# Patient Record
Sex: Male | Born: 1935 | Race: White | Hispanic: No | Marital: Married | Smoking: Former smoker
Health system: Southern US, Community
[De-identification: ages and names within clinical notes are randomized; demographics above are authoritative.]

## PROBLEM LIST (undated history)

## (undated) DIAGNOSIS — I2510 Atherosclerotic heart disease of native coronary artery without angina pectoris: Secondary | ICD-10-CM

## (undated) DIAGNOSIS — F0390 Unspecified dementia without behavioral disturbance: Secondary | ICD-10-CM

## (undated) DIAGNOSIS — I10 Essential (primary) hypertension: Secondary | ICD-10-CM

## (undated) DIAGNOSIS — I5022 Chronic systolic (congestive) heart failure: Secondary | ICD-10-CM

## (undated) DIAGNOSIS — G4730 Sleep apnea, unspecified: Secondary | ICD-10-CM

## (undated) DIAGNOSIS — K219 Gastro-esophageal reflux disease without esophagitis: Secondary | ICD-10-CM

## (undated) DIAGNOSIS — Z95810 Presence of automatic (implantable) cardiac defibrillator: Secondary | ICD-10-CM

## (undated) DIAGNOSIS — E785 Hyperlipidemia, unspecified: Secondary | ICD-10-CM

## (undated) DIAGNOSIS — I255 Ischemic cardiomyopathy: Secondary | ICD-10-CM

## (undated) DIAGNOSIS — E119 Type 2 diabetes mellitus without complications: Secondary | ICD-10-CM

## (undated) DIAGNOSIS — I340 Nonrheumatic mitral (valve) insufficiency: Secondary | ICD-10-CM

## (undated) DIAGNOSIS — I4891 Unspecified atrial fibrillation: Secondary | ICD-10-CM

## (undated) DIAGNOSIS — D649 Anemia, unspecified: Secondary | ICD-10-CM

## (undated) DIAGNOSIS — J449 Chronic obstructive pulmonary disease, unspecified: Secondary | ICD-10-CM

## (undated) HISTORY — PX: CYSTOSCOPY: SUR368

## (undated) HISTORY — DX: Unspecified atrial fibrillation: I48.91

## (undated) HISTORY — DX: Anemia, unspecified: D64.9

## (undated) HISTORY — DX: Presence of automatic (implantable) cardiac defibrillator: Z95.810

## (undated) HISTORY — DX: Ischemic cardiomyopathy: I25.5

## (undated) HISTORY — DX: Hyperlipidemia, unspecified: E78.5

## (undated) HISTORY — DX: Essential (primary) hypertension: I10

## (undated) HISTORY — DX: Gastro-esophageal reflux disease without esophagitis: K21.9

## (undated) HISTORY — PX: CHOLECYSTECTOMY: SHX55

## (undated) HISTORY — DX: Type 2 diabetes mellitus without complications: E11.9

## (undated) HISTORY — DX: Chronic systolic (congestive) heart failure: I50.22

## (undated) HISTORY — DX: Unspecified dementia, unspecified severity, without behavioral disturbance, psychotic disturbance, mood disturbance, and anxiety: F03.90

## (undated) HISTORY — DX: Nonrheumatic mitral (valve) insufficiency: I34.0

## (undated) HISTORY — DX: Chronic obstructive pulmonary disease, unspecified: J44.9

## (undated) HISTORY — DX: Sleep apnea, unspecified: G47.30

## (undated) HISTORY — PX: INSERT / REPLACE / REMOVE PACEMAKER: SUR710

## (undated) HISTORY — DX: Atherosclerotic heart disease of native coronary artery without angina pectoris: I25.10

---

## 1995-01-19 HISTORY — PX: CORONARY ANGIOPLASTY WITH STENT PLACEMENT: SHX49

## 1997-08-15 ENCOUNTER — Encounter: Admission: RE | Admit: 1997-08-15 | Discharge: 1997-11-13 | Payer: Self-pay | Admitting: Unknown Physician Specialty

## 1997-11-15 ENCOUNTER — Inpatient Hospital Stay (HOSPITAL_COMMUNITY): Admission: AD | Admit: 1997-11-15 | Discharge: 1997-11-19 | Payer: Self-pay | Admitting: Cardiology

## 2001-03-08 ENCOUNTER — Ambulatory Visit (HOSPITAL_COMMUNITY): Admission: RE | Admit: 2001-03-08 | Discharge: 2001-03-08 | Payer: Self-pay | Admitting: Cardiology

## 2001-03-10 ENCOUNTER — Encounter: Payer: Self-pay | Admitting: Neurosurgery

## 2001-03-14 ENCOUNTER — Ambulatory Visit (HOSPITAL_COMMUNITY): Admission: RE | Admit: 2001-03-14 | Discharge: 2001-03-14 | Payer: Self-pay | Admitting: Neurosurgery

## 2001-03-22 ENCOUNTER — Encounter: Payer: Self-pay | Admitting: Neurosurgery

## 2001-03-22 ENCOUNTER — Encounter: Admission: RE | Admit: 2001-03-22 | Discharge: 2001-03-22 | Payer: Self-pay | Admitting: Neurosurgery

## 2001-04-05 ENCOUNTER — Encounter: Payer: Self-pay | Admitting: Neurosurgery

## 2001-04-05 ENCOUNTER — Encounter: Admission: RE | Admit: 2001-04-05 | Discharge: 2001-04-05 | Payer: Self-pay | Admitting: Neurosurgery

## 2001-08-09 ENCOUNTER — Ambulatory Visit (HOSPITAL_COMMUNITY): Admission: RE | Admit: 2001-08-09 | Discharge: 2001-08-09 | Payer: Self-pay | Admitting: Cardiology

## 2002-02-15 ENCOUNTER — Inpatient Hospital Stay (HOSPITAL_COMMUNITY): Admission: EM | Admit: 2002-02-15 | Discharge: 2002-02-17 | Payer: Self-pay | Admitting: Cardiology

## 2002-02-26 ENCOUNTER — Encounter: Payer: Self-pay | Admitting: Emergency Medicine

## 2002-02-26 ENCOUNTER — Emergency Department (HOSPITAL_COMMUNITY): Admission: EM | Admit: 2002-02-26 | Discharge: 2002-02-26 | Payer: Self-pay | Admitting: Emergency Medicine

## 2002-03-23 ENCOUNTER — Ambulatory Visit (HOSPITAL_COMMUNITY): Admission: RE | Admit: 2002-03-23 | Discharge: 2002-03-23 | Payer: Self-pay | Admitting: Internal Medicine

## 2002-05-08 ENCOUNTER — Ambulatory Visit (HOSPITAL_COMMUNITY): Admission: RE | Admit: 2002-05-08 | Discharge: 2002-05-08 | Payer: Self-pay | Admitting: Unknown Physician Specialty

## 2003-11-01 ENCOUNTER — Inpatient Hospital Stay (HOSPITAL_COMMUNITY): Admission: AD | Admit: 2003-11-01 | Discharge: 2003-11-06 | Payer: Self-pay | Admitting: Cardiology

## 2003-11-29 ENCOUNTER — Ambulatory Visit: Payer: Self-pay | Admitting: Cardiology

## 2003-12-19 ENCOUNTER — Ambulatory Visit: Payer: Self-pay | Admitting: Cardiology

## 2003-12-31 ENCOUNTER — Ambulatory Visit: Payer: Self-pay | Admitting: Cardiology

## 2004-01-15 ENCOUNTER — Ambulatory Visit: Payer: Self-pay | Admitting: Cardiology

## 2004-01-19 HISTORY — PX: CARDIAC CATHETERIZATION: SHX172

## 2004-02-14 ENCOUNTER — Ambulatory Visit: Payer: Self-pay | Admitting: Family Medicine

## 2004-02-17 ENCOUNTER — Ambulatory Visit: Payer: Self-pay | Admitting: Cardiology

## 2004-03-16 ENCOUNTER — Ambulatory Visit: Payer: Self-pay | Admitting: Family Medicine

## 2004-03-19 ENCOUNTER — Ambulatory Visit: Payer: Self-pay | Admitting: Cardiology

## 2004-04-21 ENCOUNTER — Ambulatory Visit: Payer: Self-pay | Admitting: Internal Medicine

## 2004-04-23 ENCOUNTER — Ambulatory Visit: Payer: Self-pay | Admitting: Cardiology

## 2004-04-30 ENCOUNTER — Ambulatory Visit: Payer: Self-pay | Admitting: Cardiology

## 2004-05-15 ENCOUNTER — Ambulatory Visit: Payer: Self-pay | Admitting: Cardiology

## 2004-05-20 ENCOUNTER — Ambulatory Visit: Payer: Self-pay | Admitting: Cardiology

## 2004-05-21 ENCOUNTER — Ambulatory Visit: Payer: Self-pay | Admitting: Cardiology

## 2004-06-04 ENCOUNTER — Ambulatory Visit: Payer: Self-pay | Admitting: Cardiology

## 2004-06-12 ENCOUNTER — Ambulatory Visit: Payer: Self-pay | Admitting: Cardiology

## 2004-06-18 ENCOUNTER — Ambulatory Visit: Payer: Self-pay | Admitting: Family Medicine

## 2004-07-08 ENCOUNTER — Ambulatory Visit: Payer: Self-pay | Admitting: Family Medicine

## 2004-07-14 ENCOUNTER — Ambulatory Visit: Payer: Self-pay | Admitting: Cardiology

## 2004-07-17 ENCOUNTER — Ambulatory Visit: Payer: Self-pay | Admitting: Internal Medicine

## 2004-07-31 ENCOUNTER — Ambulatory Visit: Payer: Self-pay | Admitting: *Deleted

## 2004-08-05 ENCOUNTER — Inpatient Hospital Stay (HOSPITAL_BASED_OUTPATIENT_CLINIC_OR_DEPARTMENT_OTHER): Admission: RE | Admit: 2004-08-05 | Discharge: 2004-08-05 | Payer: Self-pay | Admitting: Internal Medicine

## 2004-08-05 ENCOUNTER — Ambulatory Visit: Payer: Self-pay | Admitting: Cardiovascular Disease

## 2004-08-05 ENCOUNTER — Ambulatory Visit: Payer: Self-pay | Admitting: Internal Medicine

## 2004-08-10 ENCOUNTER — Ambulatory Visit: Payer: Self-pay | Admitting: Cardiology

## 2004-08-17 ENCOUNTER — Ambulatory Visit: Payer: Self-pay | Admitting: Cardiology

## 2004-08-19 ENCOUNTER — Ambulatory Visit: Payer: Self-pay | Admitting: Cardiology

## 2004-08-26 ENCOUNTER — Ambulatory Visit: Payer: Self-pay | Admitting: Family Medicine

## 2004-08-28 ENCOUNTER — Ambulatory Visit: Payer: Self-pay | Admitting: Cardiology

## 2004-08-31 ENCOUNTER — Ambulatory Visit: Payer: Self-pay | Admitting: Cardiology

## 2004-09-04 ENCOUNTER — Inpatient Hospital Stay (HOSPITAL_COMMUNITY): Admission: RE | Admit: 2004-09-04 | Discharge: 2004-09-05 | Payer: Self-pay | Admitting: Internal Medicine

## 2004-09-07 ENCOUNTER — Ambulatory Visit: Payer: Self-pay | Admitting: Internal Medicine

## 2004-09-23 ENCOUNTER — Ambulatory Visit: Payer: Self-pay

## 2004-09-25 ENCOUNTER — Ambulatory Visit: Payer: Self-pay | Admitting: Cardiology

## 2004-10-02 ENCOUNTER — Ambulatory Visit: Payer: Self-pay | Admitting: Cardiology

## 2004-10-23 ENCOUNTER — Ambulatory Visit: Payer: Self-pay | Admitting: Cardiology

## 2004-11-06 ENCOUNTER — Ambulatory Visit: Payer: Self-pay | Admitting: Family Medicine

## 2004-11-24 ENCOUNTER — Ambulatory Visit: Payer: Self-pay | Admitting: Internal Medicine

## 2004-12-01 ENCOUNTER — Ambulatory Visit: Payer: Self-pay | Admitting: Family Medicine

## 2004-12-22 ENCOUNTER — Ambulatory Visit: Payer: Self-pay | Admitting: Cardiology

## 2004-12-29 ENCOUNTER — Ambulatory Visit: Payer: Self-pay | Admitting: Internal Medicine

## 2004-12-30 ENCOUNTER — Ambulatory Visit: Payer: Self-pay | Admitting: Cardiology

## 2005-01-19 ENCOUNTER — Ambulatory Visit: Payer: Self-pay | Admitting: Cardiology

## 2005-01-21 ENCOUNTER — Ambulatory Visit: Payer: Self-pay | Admitting: Cardiology

## 2005-02-01 ENCOUNTER — Ambulatory Visit: Payer: Self-pay | Admitting: Family Medicine

## 2005-02-10 ENCOUNTER — Ambulatory Visit: Payer: Self-pay | Admitting: Cardiology

## 2005-03-10 ENCOUNTER — Ambulatory Visit: Payer: Self-pay | Admitting: Cardiology

## 2005-03-30 ENCOUNTER — Ambulatory Visit: Payer: Self-pay | Admitting: Internal Medicine

## 2005-04-07 ENCOUNTER — Ambulatory Visit: Payer: Self-pay | Admitting: Cardiology

## 2005-04-14 ENCOUNTER — Ambulatory Visit: Payer: Self-pay | Admitting: Cardiology

## 2005-05-03 ENCOUNTER — Ambulatory Visit: Payer: Self-pay | Admitting: Family Medicine

## 2005-05-12 ENCOUNTER — Ambulatory Visit: Payer: Self-pay | Admitting: Cardiology

## 2005-05-17 ENCOUNTER — Ambulatory Visit: Payer: Self-pay | Admitting: Family Medicine

## 2005-05-26 ENCOUNTER — Ambulatory Visit: Payer: Self-pay | Admitting: Cardiology

## 2005-06-07 ENCOUNTER — Ambulatory Visit: Payer: Self-pay | Admitting: Family Medicine

## 2005-06-16 ENCOUNTER — Ambulatory Visit: Payer: Self-pay | Admitting: Cardiology

## 2005-06-24 ENCOUNTER — Ambulatory Visit: Payer: Self-pay | Admitting: Internal Medicine

## 2005-07-14 ENCOUNTER — Ambulatory Visit: Payer: Self-pay | Admitting: Cardiology

## 2005-07-26 ENCOUNTER — Ambulatory Visit: Payer: Self-pay | Admitting: Family Medicine

## 2005-07-28 ENCOUNTER — Ambulatory Visit: Payer: Self-pay | Admitting: Cardiology

## 2005-09-01 ENCOUNTER — Ambulatory Visit: Payer: Self-pay | Admitting: Cardiology

## 2005-09-13 ENCOUNTER — Ambulatory Visit: Payer: Self-pay | Admitting: Family Medicine

## 2005-09-15 ENCOUNTER — Ambulatory Visit: Payer: Self-pay | Admitting: Cardiology

## 2005-09-17 ENCOUNTER — Ambulatory Visit: Payer: Self-pay | Admitting: Cardiology

## 2005-10-12 ENCOUNTER — Ambulatory Visit: Payer: Self-pay | Admitting: Cardiology

## 2005-10-13 ENCOUNTER — Ambulatory Visit: Payer: Self-pay | Admitting: Cardiology

## 2005-10-14 ENCOUNTER — Ambulatory Visit: Payer: Self-pay | Admitting: Internal Medicine

## 2005-10-29 ENCOUNTER — Ambulatory Visit: Payer: Self-pay | Admitting: Cardiology

## 2005-11-02 ENCOUNTER — Ambulatory Visit: Payer: Self-pay | Admitting: Cardiology

## 2005-11-05 ENCOUNTER — Ambulatory Visit: Payer: Self-pay | Admitting: Cardiology

## 2005-11-09 ENCOUNTER — Ambulatory Visit: Payer: Self-pay | Admitting: Cardiology

## 2005-11-16 ENCOUNTER — Ambulatory Visit: Payer: Self-pay | Admitting: Cardiology

## 2005-11-23 ENCOUNTER — Ambulatory Visit: Payer: Self-pay | Admitting: Cardiology

## 2005-11-30 ENCOUNTER — Ambulatory Visit: Payer: Self-pay | Admitting: Cardiology

## 2005-12-03 ENCOUNTER — Ambulatory Visit: Payer: Self-pay

## 2005-12-10 ENCOUNTER — Ambulatory Visit: Payer: Self-pay | Admitting: Cardiology

## 2005-12-21 ENCOUNTER — Ambulatory Visit: Payer: Self-pay | Admitting: Family Medicine

## 2005-12-24 ENCOUNTER — Ambulatory Visit: Payer: Self-pay | Admitting: Cardiology

## 2005-12-28 ENCOUNTER — Ambulatory Visit: Payer: Self-pay | Admitting: Internal Medicine

## 2006-01-14 ENCOUNTER — Ambulatory Visit: Payer: Self-pay | Admitting: Cardiology

## 2006-01-28 ENCOUNTER — Ambulatory Visit: Payer: Self-pay | Admitting: Cardiology

## 2006-02-11 ENCOUNTER — Ambulatory Visit: Payer: Self-pay | Admitting: Cardiology

## 2006-03-04 ENCOUNTER — Ambulatory Visit: Payer: Self-pay | Admitting: Cardiology

## 2006-03-14 ENCOUNTER — Ambulatory Visit: Payer: Self-pay | Admitting: Cardiology

## 2006-03-29 ENCOUNTER — Ambulatory Visit: Payer: Self-pay | Admitting: Internal Medicine

## 2006-04-04 ENCOUNTER — Ambulatory Visit: Payer: Self-pay | Admitting: Cardiology

## 2006-04-18 ENCOUNTER — Ambulatory Visit: Payer: Self-pay | Admitting: Cardiology

## 2006-05-02 ENCOUNTER — Ambulatory Visit: Payer: Self-pay | Admitting: Cardiology

## 2006-05-09 ENCOUNTER — Ambulatory Visit: Payer: Self-pay | Admitting: Family Medicine

## 2006-06-03 ENCOUNTER — Ambulatory Visit: Payer: Self-pay | Admitting: Cardiology

## 2006-06-21 ENCOUNTER — Ambulatory Visit: Payer: Self-pay | Admitting: Internal Medicine

## 2006-06-30 ENCOUNTER — Ambulatory Visit: Payer: Self-pay | Admitting: Physician Assistant

## 2006-07-04 ENCOUNTER — Ambulatory Visit: Payer: Self-pay | Admitting: Family Medicine

## 2006-07-08 ENCOUNTER — Ambulatory Visit: Payer: Self-pay | Admitting: Cardiology

## 2006-07-26 ENCOUNTER — Ambulatory Visit: Payer: Self-pay | Admitting: Cardiology

## 2006-08-09 ENCOUNTER — Ambulatory Visit: Payer: Self-pay | Admitting: Cardiology

## 2006-08-17 ENCOUNTER — Ambulatory Visit: Payer: Self-pay | Admitting: Cardiology

## 2006-09-05 ENCOUNTER — Ambulatory Visit: Payer: Self-pay | Admitting: Cardiology

## 2006-09-20 ENCOUNTER — Ambulatory Visit: Payer: Self-pay | Admitting: Internal Medicine

## 2006-09-20 ENCOUNTER — Ambulatory Visit: Payer: Self-pay | Admitting: Cardiology

## 2006-10-11 ENCOUNTER — Ambulatory Visit: Payer: Self-pay | Admitting: Cardiology

## 2006-10-27 ENCOUNTER — Ambulatory Visit: Payer: Self-pay | Admitting: Internal Medicine

## 2006-10-31 ENCOUNTER — Ambulatory Visit: Payer: Self-pay | Admitting: Cardiology

## 2006-11-14 ENCOUNTER — Ambulatory Visit: Payer: Self-pay | Admitting: Cardiology

## 2006-12-12 ENCOUNTER — Ambulatory Visit: Payer: Self-pay | Admitting: Cardiology

## 2006-12-20 ENCOUNTER — Ambulatory Visit: Payer: Self-pay | Admitting: Internal Medicine

## 2006-12-23 ENCOUNTER — Ambulatory Visit: Payer: Self-pay | Admitting: Cardiology

## 2007-01-20 ENCOUNTER — Ambulatory Visit: Payer: Self-pay | Admitting: Cardiology

## 2007-01-27 ENCOUNTER — Ambulatory Visit: Payer: Self-pay | Admitting: Cardiology

## 2007-02-10 ENCOUNTER — Ambulatory Visit: Payer: Self-pay | Admitting: Cardiology

## 2007-03-03 ENCOUNTER — Ambulatory Visit: Payer: Self-pay | Admitting: Cardiology

## 2007-03-21 ENCOUNTER — Ambulatory Visit: Payer: Self-pay | Admitting: Internal Medicine

## 2007-03-24 ENCOUNTER — Ambulatory Visit: Payer: Self-pay | Admitting: Cardiology

## 2007-04-21 ENCOUNTER — Ambulatory Visit: Payer: Self-pay | Admitting: Cardiology

## 2007-05-23 ENCOUNTER — Ambulatory Visit: Payer: Self-pay | Admitting: Cardiology

## 2007-06-15 ENCOUNTER — Ambulatory Visit: Payer: Self-pay | Admitting: Cardiology

## 2007-07-03 ENCOUNTER — Ambulatory Visit: Payer: Self-pay | Admitting: Cardiology

## 2007-07-03 ENCOUNTER — Encounter: Payer: Self-pay | Admitting: Physician Assistant

## 2007-07-04 ENCOUNTER — Encounter: Payer: Self-pay | Admitting: Cardiology

## 2007-07-06 ENCOUNTER — Ambulatory Visit: Payer: Self-pay | Admitting: Internal Medicine

## 2007-07-12 ENCOUNTER — Ambulatory Visit: Payer: Self-pay | Admitting: Cardiology

## 2007-07-17 ENCOUNTER — Encounter: Payer: Self-pay | Admitting: Physician Assistant

## 2007-07-17 ENCOUNTER — Ambulatory Visit: Payer: Self-pay | Admitting: Cardiology

## 2007-08-10 ENCOUNTER — Ambulatory Visit: Payer: Self-pay | Admitting: Cardiology

## 2007-09-11 ENCOUNTER — Ambulatory Visit: Payer: Self-pay | Admitting: Cardiology

## 2007-10-03 ENCOUNTER — Ambulatory Visit: Payer: Self-pay | Admitting: Internal Medicine

## 2007-10-17 ENCOUNTER — Ambulatory Visit: Payer: Self-pay | Admitting: Cardiology

## 2007-10-31 ENCOUNTER — Ambulatory Visit: Payer: Self-pay | Admitting: Cardiology

## 2007-11-21 ENCOUNTER — Ambulatory Visit: Payer: Self-pay | Admitting: Cardiology

## 2007-12-12 ENCOUNTER — Ambulatory Visit: Payer: Self-pay | Admitting: Cardiology

## 2007-12-26 ENCOUNTER — Ambulatory Visit: Payer: Self-pay | Admitting: Cardiology

## 2008-01-03 ENCOUNTER — Ambulatory Visit: Payer: Self-pay | Admitting: Internal Medicine

## 2008-01-23 ENCOUNTER — Ambulatory Visit: Payer: Self-pay | Admitting: Cardiology

## 2008-02-09 ENCOUNTER — Ambulatory Visit: Payer: Self-pay | Admitting: Internal Medicine

## 2008-02-12 ENCOUNTER — Encounter: Payer: Self-pay | Admitting: Internal Medicine

## 2008-02-27 ENCOUNTER — Ambulatory Visit: Payer: Self-pay | Admitting: Cardiology

## 2008-03-26 ENCOUNTER — Ambulatory Visit: Payer: Self-pay | Admitting: Cardiology

## 2008-04-15 ENCOUNTER — Ambulatory Visit: Payer: Self-pay | Admitting: Internal Medicine

## 2008-04-17 ENCOUNTER — Ambulatory Visit: Payer: Self-pay | Admitting: Cardiology

## 2008-05-21 ENCOUNTER — Ambulatory Visit: Payer: Self-pay

## 2008-06-04 ENCOUNTER — Ambulatory Visit: Payer: Self-pay | Admitting: Cardiology

## 2008-06-12 DIAGNOSIS — E119 Type 2 diabetes mellitus without complications: Secondary | ICD-10-CM

## 2008-06-12 DIAGNOSIS — I10 Essential (primary) hypertension: Secondary | ICD-10-CM

## 2008-06-12 DIAGNOSIS — I5022 Chronic systolic (congestive) heart failure: Secondary | ICD-10-CM

## 2008-06-12 DIAGNOSIS — J4489 Other specified chronic obstructive pulmonary disease: Secondary | ICD-10-CM | POA: Insufficient documentation

## 2008-06-12 DIAGNOSIS — G4730 Sleep apnea, unspecified: Secondary | ICD-10-CM | POA: Insufficient documentation

## 2008-06-12 DIAGNOSIS — I4891 Unspecified atrial fibrillation: Secondary | ICD-10-CM | POA: Insufficient documentation

## 2008-06-12 DIAGNOSIS — D649 Anemia, unspecified: Secondary | ICD-10-CM | POA: Insufficient documentation

## 2008-06-12 DIAGNOSIS — I2510 Atherosclerotic heart disease of native coronary artery without angina pectoris: Secondary | ICD-10-CM | POA: Insufficient documentation

## 2008-06-12 DIAGNOSIS — K219 Gastro-esophageal reflux disease without esophagitis: Secondary | ICD-10-CM | POA: Insufficient documentation

## 2008-06-12 DIAGNOSIS — N289 Disorder of kidney and ureter, unspecified: Secondary | ICD-10-CM | POA: Insufficient documentation

## 2008-06-12 DIAGNOSIS — I080 Rheumatic disorders of both mitral and aortic valves: Secondary | ICD-10-CM

## 2008-06-12 DIAGNOSIS — E785 Hyperlipidemia, unspecified: Secondary | ICD-10-CM | POA: Insufficient documentation

## 2008-06-12 DIAGNOSIS — Z95810 Presence of automatic (implantable) cardiac defibrillator: Secondary | ICD-10-CM | POA: Insufficient documentation

## 2008-06-12 DIAGNOSIS — I2589 Other forms of chronic ischemic heart disease: Secondary | ICD-10-CM | POA: Insufficient documentation

## 2008-06-12 DIAGNOSIS — J449 Chronic obstructive pulmonary disease, unspecified: Secondary | ICD-10-CM

## 2008-06-18 ENCOUNTER — Ambulatory Visit: Payer: Self-pay | Admitting: Cardiology

## 2008-06-21 ENCOUNTER — Ambulatory Visit: Payer: Self-pay | Admitting: Internal Medicine

## 2008-07-05 ENCOUNTER — Encounter: Payer: Self-pay | Admitting: Internal Medicine

## 2008-07-09 ENCOUNTER — Ambulatory Visit: Payer: Self-pay | Admitting: Cardiology

## 2008-07-09 ENCOUNTER — Telehealth: Payer: Self-pay | Admitting: Cardiology

## 2008-08-06 ENCOUNTER — Ambulatory Visit: Payer: Self-pay

## 2008-09-02 ENCOUNTER — Encounter: Payer: Self-pay | Admitting: *Deleted

## 2008-09-06 ENCOUNTER — Encounter: Payer: Self-pay | Admitting: Cardiology

## 2008-09-06 ENCOUNTER — Ambulatory Visit: Payer: Self-pay | Admitting: Cardiology

## 2008-09-06 LAB — CONVERTED CEMR LAB: Prothrombin Time: 25.5 s

## 2008-09-09 ENCOUNTER — Telehealth: Payer: Self-pay | Admitting: Cardiology

## 2008-09-23 ENCOUNTER — Encounter: Payer: Self-pay | Admitting: Internal Medicine

## 2008-09-25 ENCOUNTER — Ambulatory Visit: Payer: Self-pay | Admitting: Internal Medicine

## 2008-10-04 ENCOUNTER — Ambulatory Visit: Payer: Self-pay | Admitting: Cardiology

## 2008-10-10 ENCOUNTER — Encounter: Payer: Self-pay | Admitting: Internal Medicine

## 2008-11-01 ENCOUNTER — Ambulatory Visit: Payer: Self-pay | Admitting: Cardiology

## 2008-11-01 LAB — CONVERTED CEMR LAB: POC INR: 2

## 2008-11-21 ENCOUNTER — Ambulatory Visit: Payer: Self-pay | Admitting: Cardiology

## 2008-11-25 ENCOUNTER — Encounter: Payer: Self-pay | Admitting: Cardiology

## 2008-11-25 ENCOUNTER — Telehealth (INDEPENDENT_AMBULATORY_CARE_PROVIDER_SITE_OTHER): Payer: Self-pay | Admitting: *Deleted

## 2008-11-29 ENCOUNTER — Ambulatory Visit: Payer: Self-pay | Admitting: Cardiology

## 2008-11-29 LAB — CONVERTED CEMR LAB: POC INR: 1.5

## 2008-12-06 ENCOUNTER — Encounter: Payer: Self-pay | Admitting: Cardiology

## 2008-12-10 ENCOUNTER — Ambulatory Visit: Payer: Self-pay | Admitting: Cardiology

## 2008-12-10 LAB — CONVERTED CEMR LAB: POC INR: 1.7

## 2008-12-24 ENCOUNTER — Encounter: Payer: Self-pay | Admitting: Internal Medicine

## 2008-12-27 ENCOUNTER — Encounter: Payer: Self-pay | Admitting: Cardiology

## 2009-01-02 ENCOUNTER — Encounter: Payer: Self-pay | Admitting: Internal Medicine

## 2009-01-03 ENCOUNTER — Ambulatory Visit: Payer: Self-pay | Admitting: Cardiology

## 2009-01-24 ENCOUNTER — Ambulatory Visit: Payer: Self-pay | Admitting: Cardiology

## 2009-02-21 ENCOUNTER — Ambulatory Visit: Payer: Self-pay | Admitting: Cardiology

## 2009-02-21 LAB — CONVERTED CEMR LAB: POC INR: 3.1

## 2009-02-26 ENCOUNTER — Encounter: Payer: Self-pay | Admitting: Cardiology

## 2009-03-21 ENCOUNTER — Ambulatory Visit: Payer: Self-pay | Admitting: Cardiology

## 2009-03-21 LAB — CONVERTED CEMR LAB: POC INR: 2.9

## 2009-03-25 ENCOUNTER — Encounter: Payer: Self-pay | Admitting: Cardiology

## 2009-03-25 ENCOUNTER — Inpatient Hospital Stay (HOSPITAL_COMMUNITY): Admission: EM | Admit: 2009-03-25 | Discharge: 2009-03-28 | Payer: Self-pay | Admitting: Internal Medicine

## 2009-03-25 ENCOUNTER — Ambulatory Visit: Payer: Self-pay | Admitting: Internal Medicine

## 2009-03-25 ENCOUNTER — Telehealth: Payer: Self-pay | Admitting: Internal Medicine

## 2009-03-28 ENCOUNTER — Encounter: Payer: Self-pay | Admitting: Internal Medicine

## 2009-04-11 ENCOUNTER — Ambulatory Visit: Payer: Self-pay | Admitting: Internal Medicine

## 2009-04-18 ENCOUNTER — Ambulatory Visit: Payer: Self-pay | Admitting: Cardiology

## 2009-04-18 LAB — CONVERTED CEMR LAB: POC INR: 2.7

## 2009-05-05 ENCOUNTER — Ambulatory Visit: Payer: Self-pay | Admitting: Cardiology

## 2009-05-20 ENCOUNTER — Ambulatory Visit: Payer: Self-pay | Admitting: Cardiology

## 2009-05-20 LAB — CONVERTED CEMR LAB: POC INR: 2.3

## 2009-06-17 ENCOUNTER — Ambulatory Visit: Payer: Self-pay | Admitting: Cardiology

## 2009-06-17 LAB — CONVERTED CEMR LAB: POC INR: 2.8

## 2009-06-27 ENCOUNTER — Ambulatory Visit: Payer: Self-pay | Admitting: Internal Medicine

## 2009-07-15 ENCOUNTER — Ambulatory Visit: Payer: Self-pay | Admitting: Cardiology

## 2009-07-15 ENCOUNTER — Telehealth (INDEPENDENT_AMBULATORY_CARE_PROVIDER_SITE_OTHER): Payer: Self-pay | Admitting: *Deleted

## 2009-08-12 ENCOUNTER — Ambulatory Visit: Payer: Self-pay | Admitting: Cardiology

## 2009-08-12 LAB — CONVERTED CEMR LAB: POC INR: 1.7

## 2009-09-03 ENCOUNTER — Encounter: Payer: Self-pay | Admitting: Internal Medicine

## 2009-09-09 ENCOUNTER — Ambulatory Visit: Payer: Self-pay | Admitting: Cardiology

## 2009-09-09 LAB — CONVERTED CEMR LAB: POC INR: 1.7

## 2009-09-25 ENCOUNTER — Telehealth: Payer: Self-pay | Admitting: Internal Medicine

## 2009-09-26 ENCOUNTER — Encounter: Payer: Self-pay | Admitting: Internal Medicine

## 2009-09-30 ENCOUNTER — Ambulatory Visit: Payer: Self-pay | Admitting: Cardiology

## 2009-09-30 LAB — CONVERTED CEMR LAB: POC INR: 3.4

## 2009-10-06 ENCOUNTER — Ambulatory Visit: Payer: Self-pay | Admitting: Internal Medicine

## 2009-10-14 ENCOUNTER — Ambulatory Visit: Payer: Self-pay | Admitting: Cardiology

## 2009-10-28 ENCOUNTER — Ambulatory Visit: Payer: Self-pay | Admitting: Cardiology

## 2009-10-29 ENCOUNTER — Encounter: Payer: Self-pay | Admitting: Internal Medicine

## 2009-11-25 ENCOUNTER — Ambulatory Visit: Payer: Self-pay | Admitting: Cardiology

## 2009-11-25 LAB — CONVERTED CEMR LAB: POC INR: 3

## 2009-12-15 ENCOUNTER — Ambulatory Visit: Payer: Self-pay

## 2009-12-23 ENCOUNTER — Ambulatory Visit: Payer: Self-pay | Admitting: Cardiology

## 2009-12-23 LAB — CONVERTED CEMR LAB: POC INR: 2.8

## 2010-01-07 ENCOUNTER — Ambulatory Visit: Payer: Self-pay | Admitting: Internal Medicine

## 2010-01-20 ENCOUNTER — Ambulatory Visit: Admission: RE | Admit: 2010-01-20 | Discharge: 2010-01-20 | Payer: Self-pay | Source: Home / Self Care

## 2010-02-17 ENCOUNTER — Ambulatory Visit: Admit: 2010-02-17 | Payer: Self-pay

## 2010-02-19 NOTE — Medication Information (Signed)
Summary: ccr-lr  Anticoagulant Therapy  Managed by: Vashti Hey, RN Referring MD: Andee Lineman PCP: Dr. Ardeen Garland Supervising MD: Diona Browner MD, Remi Deter Indication 1: Atrial Fibrillation (ICD-427.31) Lab [facility] [place] Site: [facility] INR POC 2.4  Dietary changes: no    Health status changes: no    Bleeding/hemorrhagic complications: no    Recent/future hospitalizations: no    Any changes in medication regimen? no    Recent/future dental: no  Any missed doses?: no       Is patient compliant with meds? yes       Allergies: 1)  ! Pcn 2)  ! Celebrex 3)  ! * Ambien  Anticoagulation Management History:      The patient is taking warfarin and comes in today for a routine follow up visit.  Positive risk factors for bleeding include an age of 75 years or older and presence of serious comorbidities.  The bleeding index is 'intermediate risk'.  Positive CHADS2 values include History of CHF, History of HTN, Age > 42 years old, and History of Diabetes.  The start date was 10/18/2002.  His last INR was 2.2.  Anticoagulation responsible provider: Diona Browner MD, Remi Deter.  INR POC: 2.4.  Cuvette Lot#: [id number].  Exp: 10/11.    Anticoagulation Management Assessment/Plan:      The patient's current anticoagulation dose is Warfarin sodium 5 mg tabs: Take 1-2 tablet by mouth once a day.  The target INR is 2 - 3.  The next INR is due 02/17/2010.  Anticoagulation instructions were given to patient.  Results were reviewed/authorized by Vashti Hey, RN.  He was notified by Vashti Hey RN.         Prior Anticoagulation Instructions: INR 2.8 Continue coumadin 5mg  once daily except 2.5mg  on M,W,F  Current Anticoagulation Instructions: INR 2.4 Continue coumadin 5mg  once daily except 2.5mg  on M,W,F

## 2010-02-19 NOTE — Medication Information (Signed)
Summary: ccr-lr  Anticoagulant Therapy  Managed by: Vashti Hey, RN Referring MD: Andee Lineman PCP: Dr. Ardeen Garland Supervising MD: Antoine Poche MD, Fayrene Fearing Indication 1: Atrial Fibrillation (ICD-427.31) Lab [facility] [place] Site: [facility] Care Clinic INR POC 2.8  Dietary changes: no    Health status changes: no    Bleeding/hemorrhagic complications: no    Recent/future hospitalizations: no    Any changes in medication regimen? no    Recent/future dental: no  Any missed doses?: no       Is patient compliant with meds? yes       Allergies: 1)  ! Pcn 2)  ! Celebrex 3)  ! Clarene Essex  Anticoagulation Management History:      The patient is taking warfarin and comes in today for a routine follow up visit.  Positive risk factors for bleeding include an age of 2 years or older and presence of serious comorbidities.  The bleeding index is 'intermediate risk'.  Positive CHADS2 values include History of CHF, History of HTN, Age > 47 years old, and History of Diabetes.  The start date was 10/18/2002.  His last INR was 2.2.  Anticoagulation responsible provider: Antoine Poche MD, Fayrene Fearing.  INR POC: 2.8.  Cuvette Lot#: [id number].  Exp: 10/11.    Anticoagulation Management Assessment/Plan:      The patient's current anticoagulation dose is Warfarin sodium 5 mg tabs: Take 1-2 tablet by mouth once a day.  The target INR is 2 - 3.  The next INR is due 07/15/2009.  Anticoagulation instructions were given to patient.  Results were reviewed/authorized by Vashti Hey, RN.  He was notified by Vashti Hey RN.         Prior Anticoagulation Instructions: INR 2.3 Continue coumadin 2.5mg  once daily except 5mg  on S,T,Th  Current Anticoagulation Instructions: INR 2.8 Continue coumadin 2.5mg  once daily except 5mg  on S,T,Th

## 2010-02-19 NOTE — Medication Information (Signed)
Summary: ccr-lr  Anticoagulant Therapy  Managed by: Vashti Hey, RN Referring MD: Andee Lineman PCP: Dr. Ardeen Garland Supervising MD: Diona Browner MD, Remi Deter Indication 1: Atrial Fibrillation (ICD-427.31) Lab Used: [facility] [place] Site: [facility] Clinic INR POC 2.7  Dietary changes: no    Health status changes: no    Bleeding/hemorrhagic complications: no    Recent/future hospitalizations: no    Any changes in medication regimen? no    Recent/future dental: no  Any missed doses?: no       Is patient compliant with meds? yes       Allergies: 1)  ! Pcn 2)  ! Celebrex 3)  ! Clarene Essex  Anticoagulation Management History:      The patient is taking warfarin and comes in today for a routine follow up visit.  Positive risk factors for bleeding include an age of 75 years or older and presence of serious comorbidities.  The bleeding index is 'intermediate risk'.  Positive CHADS2 values include History of CHF, History of HTN, Age > 87 years old, and History of Diabetes.  The start date was 10/18/2002.  His last INR was 2.2.  Anticoagulation responsible provider: Diona Browner MD, Remi Deter.  INR POC: 2.7.  Cuvette Lot#: [id number].  Exp: 10/11.    Anticoagulation Management Assessment/Plan:      The patient's current anticoagulation dose is Warfarin sodium 5 mg tabs: Take 1-2 tablet by mouth once a day.  The target INR is 2 - 3.  The next INR is due 02/21/2009.  Anticoagulation instructions were given to patient.  Results were reviewed/authorized by Vashti Hey, RN.  He was notified by Vashti Hey RN.         Prior Anticoagulation Instructions: INR 2.5 Continue coumadin 5mg  once daily except 2.5mg  on M,W,F  Current Anticoagulation Instructions: INR 2.7 Continue coumadin 5mg  once daily except 2.5mg  on M,W,F

## 2010-02-19 NOTE — Medication Information (Signed)
Summary: ccr-lr  Anticoagulant Therapy  Managed by: Vashti Hey, RN Referring MD: Andee Lineman PCP: Dr. Ardeen Garland Supervising MD: Andee Lineman MD, Michelle Piper Indication 1: Atrial Fibrillation (ICD-427.31) Lab [facility]  Site: [facility] Care Clinic INR POC 3.4  Dietary changes: no    Health status changes: no    Bleeding/hemorrhagic complications: no    Recent/future hospitalizations: no    Any changes in medication regimen? no    Recent/future dental: no  Any missed doses?: no       Is patient compliant with meds? yes       Allergies: 1)  ! Pcn 2)  ! Celebrex 3)  ! Clarene Essex  Anticoagulation Management History:      The patient is taking warfarin and comes in today for a routine follow up visit.  Positive risk factors for bleeding include an age of 75 years or older and presence of serious comorbidities.  The bleeding index is 'intermediate risk'.  Positive CHADS2 values include History of CHF, History of HTN, Age > 87 years old, and History of Diabetes.  The start date was 10/18/2002.  His last INR was 2.2.  Anticoagulation responsible Jahayra Mazo: Andee Lineman MD, Michelle Piper.  INR POC: 3.4.  Cuvette Lot#: [id number].  Exp: 10/11.    Anticoagulation Management Assessment/Plan:      The patient's current anticoagulation dose is Warfarin sodium 5 mg tabs: Take 1-2 tablet by mouth once a day.  The target INR is 2 - 3.  The next INR is due 10/28/2009.  Anticoagulation instructions were given to patient.  Results were reviewed/authorized by Vashti Hey, RN.  He was notified by Vashti Hey RN.         Prior Anticoagulation Instructions: INR 1.7 Increase coumadin to 5mg  once daily except 2.5mg  on Mondays and Fridays  Current Anticoagulation Instructions: INR 3.4 Take coumadin 2.5mg  tonight then decrease dose to 5mg  once daily except 2.5mg  on M,W,F

## 2010-02-19 NOTE — Assessment & Plan Note (Signed)
Summary: NEW DEVICE-SRS   Visit Type:  Pacemaker check Primary Provider:  Dr. Ardeen Garland  CC:  pacemaker check .  History of Present Illness: The patient presents today for routine electrophysiology followup. He reports doing very well since having his fidelis lead revision. The patient denies symptoms of palpitations, chest pain, shortness of breath, orthopnea, PND, lower extremity edema, dizziness, presyncope, syncope, or neurologic sequela. The patient is tolerating medications without difficulties and is otherwise without complaint today.   Preventive Screening-Counseling & Management  Alcohol-Tobacco     Smoking Status: quit     Year Quit: 1996  Current Medications (verified): 1)  Amiodarone Hcl 200 Mg Tabs (Amiodarone Hcl) .... Take 1/2 Tablet (100mg ) Daily 2)  Isosorbide Mononitrate Cr 60 Mg Xr24h-Tab (Isosorbide Mononitrate) .... Take 1 Tablet By Mouth Once A Day 3)  Lasix 80 Mg Tabs (Furosemide) .... One and A Half By Mouth Two Times A Day 4)  Lipitor 10 Mg Tabs (Atorvastatin Calcium) .... Take 1 Tablet By Mouth Once A Day 5)  Toprol Xl 25 Mg Xr24h-Tab (Metoprolol Succinate) .... Take 1 Tablet By Mouth Once A Day 6)  Warfarin Sodium 5 Mg Tabs (Warfarin Sodium) .... Take 1-2 Tablet By Mouth Once A Day 7)  Avapro 150 Mg Tabs (Irbesartan) .... Take 1 Tablet By Mouth Once A Day 8)  Aspirin 81 Mg Tbec (Aspirin) .... Take 1 Tablet By Mouth Once A Day 9)  Digoxin 0.125 Mg Tabs (Digoxin) .... Take 1 Tablet By Mouth Once A Day 10)  Omeprazole 20 Mg Cpdr (Omeprazole) .... Take 1 Tablet By Mouth Once A Day 11)  Cymbalta 60 Mg Cpep (Duloxetine Hcl) .... Take 1 Tablet By Mouth Once A Day 12)  Renal Multivitamin/zinc  Tabs (Multiple Vitamin) .... Take 1 Tablet By Mouth Once A Day 13)  Neurontin 100 Mg Caps (Gabapentin) .... Take 1 Tablet By Mouth Once A Day 14)  Synthroid 100 Mcg Tabs (Levothyroxine Sodium) .... One By Mouth Daily 15)  Tylenol Extra Strength 500 Mg Tabs (Acetaminophen)  .... 2 By Mouth Two Times A Day 16)  Valium 5 Mg Tabs (Diazepam) .... One By Mouth Three Times A Day 17)  Vitamin D 16109 Unit Tabs (Cholecalciferol) .... One By Mouth Weekly 18)  Humulin N Pen 100 Unit/ml Susp (Insulin Isophane Human) .... 55 Units Two Times A Day 19)  Humulin R 100 Unit/ml Soln (Insulin Regular Human) .... 7-10 Units Two Times A Day 20)  Allergy Relief 10 Mg Tabs (Loratadine) .... Take 1 Tablet By Mouth Once A Day  Allergies: 1)  ! Pcn 2)  ! Celebrex 3)  ! Clarene Essex  Comments:  Nurse/Medical Assistant: The patients medication list was updated, however, the patient is on some medications where they do not know the name or dosage at this time. Instructed to contact our office with details. Will update medication list at that time.  Past History:  Past Medical History: Reviewed history from 11/21/2008 and no changes required. SYSTOLIC HEART FAILURE, CHRONIC (ICD-428.22) (EF was 20%, 50% echo 2007) ICD - IN SITU (ICD-V45.02) CARDIOMYOPATHY, ISCHEMIC (ICD-414.8) ATRIAL FIBRILLATION (ICD-427.31) MITRAL REGURGITATION, 0 (MILD) (ICD-396.3) CAD, UNSPECIFIED SITE (ICD-414.00)Coronary artery disease, status post multiple percutaneous coronary nterventions in the past. Last cardiac catheterization in July 2006:  Totally occluded RCA with left-to-right     collaterals, and a 70-75% mid-to-distal obtuse marginal stenosis - medical therapy.) HYPERTENSION, UNSPECIFIED (ICD-401.9) HYPERLIPIDEMIA-MIXED (ICD-272.4) COPD (ICD-496) RENAL DISEASE, CHRONIC (ICD-593.9) GERD (ICD-530.81) DIABETES MELLITUS (ICD-250.00) SLEEP APNEA (ICD-780.57) ANEMIA (  ICD-285.9)  Past Surgical History: Reviewed history from 11/21/2008 and no changes required. ICD - [employer] InSync Sentry [phone] - 09/04/04 Cholecystectomy Cystoscopy for bladder CA years ago  Social History: Reviewed history from 06/12/2008 and no changes required. Retired  Married  Tobacco Use - Former. - 1996 Alcohol Use -  no Regular Exercise - no Drug Use - no  Review of Systems       All systems are reviewed and negative except as listed in the HPI.   Vital Signs:  Patient profile:   77 year old male Height:      72 inches Weight:      231 pounds O2 Sat:      85 % on Room air Pulse rate:   87 / minute BP sitting:   124 / 73  (left arm)  Vitals Entered By: Youlanda Mighty RN (June 27, 2009 3:47 PM)  O2 Flow:  Room air CC: pacemaker check    Physical Exam  General:  elderly, NAD Head:  normocephalic and atraumatic Eyes:  PERRLA/EOM intact; conjunctiva and lids normal. Mouth:  Teeth, gums and palate normal. Oral mucosa normal. Neck:  supple Chest Wall:  ICD pocket is well healed Lungs:  Clear bilaterally to auscultation and percussion. Heart:  RRR Abdomen:  Bowel sounds positive; abdomen soft and non-tender without masses, organomegaly, or hernias noted. No hepatosplenomegaly. Msk:  Back normal, normal gait. Muscle strength and tone normal. Pulses:  pulses normal in all 4 extremities Extremities:  No clubbing or cyanosis. Neurologic:  Alert and oriented x 3.    ICD Specifications Following MD:  Sherryl Manges, MD     ICD Vendor:  [employer]     ICD Model Number:  7299     ICD Serial Number:  [id number] ICD DOI:  09/04/2004     ICD Implanting MD:  Sherryl Manges, MD  Lead 1:    Location: RA     DOI: 09/04/2004     Model #: [id number]     Serial #: [id number]     Status: active Lead 2:    Location: RV     DOI: 09/04/2004     Model #: [id number]     Serial #: [id number]     Status: active Lead 3:    Location: LV     DOI: 09/04/2004     Model #: [id number]     Serial #: [id number]     Status: active  Indications::  CHF, NICM   ICD Follow Up Remote Check?  No Battery Voltage:  3.21 V     Charge Time:  8.5 seconds     Underlying rhythm:  SR ICD Dependent:  No       ICD Device Measurements Atrium:  Amplitude: 2.4 mV, Impedance: 494 ohms, Threshold: 0.75 V at 0.4 msec Right Ventricle:  Amplitude: 20 mV,  Impedance: 627 ohms, Threshold: 1.25 V at 0.6 msec Left Ventricle:  Impedance: 494 ohms, Threshold: 1.0 V at 0.4 msec Shock Impedance: 54/71 ohms   Episodes MS Episodes:  0     Percent Mode Switch:  0     Coumadin:  Yes Shock:  0     ATP:  0     Nonsustained:  0     Atrial Pacing:  0.2%     Ventricular Pacing:  99.9%  Brady Parameters Mode DDD     Lower Rate Limit:  50     Upper Rate Limit 130 PAV 190     Sensed AV  Delay:  160  Tachy Zones VF:  200     VT:  FVT VIA VF 250     VT1:  176     Tech Comments:  Atrial sensitivity reprogrammed 0.82msec for FFRW.  RV output reprogrammed 2.5@ 0.6.  Optivol and thoracic impedance abnormal 5/12-6/3.  Carelink transmissions every 3 months.  ROV 1 year with Dr. Johney Frame in [place]. Altha Harm, LPN  June 27, 2009 4:18 PM  MD Comments:  agree  Impression & Recommendations:  Problem # 1:  ICD - IN SITU (ICD-V45.02) normal ICD function changes as above  Problem # 2:  SYSTOLIC HEART FAILURE, CHRONIC (ICD-428.22) stable continue current medicine  Problem # 3:  ATRIAL FIBRILLATION (ICD-427.31) stable continue coumadin and amiodarone  Problem # 4:  HYPERTENSION, UNSPECIFIED (ICD-401.9) stable  Patient Instructions: 1)  carelink checks every 3 months 2)  return in 12 months

## 2010-02-19 NOTE — Medication Information (Signed)
Summary: ccr-lr  Anticoagulant Therapy  Managed by: Vashti Hey, RN Referring MD: Andee Lineman PCP: Dr. Ardeen Garland Supervising MD: Andee Lineman MD, Michelle Piper Indication 1: Atrial Fibrillation (ICD-427.31) Lab Used: [facility] [place] Site: [facility] INR POC 1.7  Dietary changes: no    Health status changes: no    Bleeding/hemorrhagic complications: no    Recent/future hospitalizations: no    Any changes in medication regimen? no    Recent/future dental: no  Any missed doses?: no       Is patient compliant with meds? yes       Allergies: 1)  ! Pcn 2)  ! Celebrex 3)  ! Clarene Essex  Anticoagulation Management History:      The patient is taking warfarin and comes in today for a routine follow up visit.  Positive risk factors for bleeding include an age of 75 years or older and presence of serious comorbidities.  The bleeding index is 'intermediate risk'.  Positive CHADS2 values include History of CHF, History of HTN, Age > 28 years old, and History of Diabetes.  The start date was 10/18/2002.  His last INR was 2.2.  Anticoagulation responsible provider: Andee Lineman MD, Michelle Piper.  INR POC: 1.7.  Cuvette Lot#: [id number].  Exp: 10/11.    Anticoagulation Management Assessment/Plan:      The patient's current anticoagulation dose is Warfarin sodium 5 mg tabs: Take 1-2 tablet by mouth once a day.  The target INR is 2 - 3.  The next INR is due 09/30/2009.  Anticoagulation instructions were given to patient.  Results were reviewed/authorized by Vashti Hey, RN.  He was notified by Vashti Hey RN.         Prior Anticoagulation Instructions: INR 1.7 Take coumadin 1 1/2 tablets tonight, 1 tablet tomorrow night then resume 1/2 tablet once daily except 1 tablet on S,T,Th  Current Anticoagulation Instructions: INR 1.7 Increase coumadin to 5mg  once daily except 2.5mg  on Mondays and Fridays

## 2010-02-19 NOTE — Cardiovascular Report (Signed)
Summary: [facility]/ INTERROGATION QUICK LOOK  [facility] Device Clinic/ INTERROGATION QUICK LOOK   Imported By: Dorise Hiss 07/01/2009 15:13:03  _____________________________________________________________________  External Attachment:    Type:   Image     Comment:   External Document

## 2010-02-19 NOTE — Cardiovascular Report (Signed)
Summary: Office Visit   Office Visit   Imported By: Roderic Ovens 04/25/2009 13:24:40  _____________________________________________________________________  External Attachment:    Type:   Image     Comment:   External Document

## 2010-02-19 NOTE — Medication Information (Signed)
Summary: ccr  Anticoagulant Therapy  Managed by: Vashti Hey, RN Referring MD: Andee Lineman PCP: Dr. Ardeen Garland Supervising MD: Andee Lineman MD, Michelle Piper Indication 1: Atrial Fibrillation (ICD-427.31) Lab Used: [facility] [place] Site: [facility] Clinic INR POC 3.1  Dietary changes: no    Health status changes: no    Bleeding/hemorrhagic complications: no    Recent/future hospitalizations: no    Any changes in medication regimen? no    Recent/future dental: no  Any missed doses?: no       Is patient compliant with meds? yes       Allergies: 1)  ! Pcn 2)  ! Celebrex 3)  ! Clarene Essex  Anticoagulation Management History:      The patient is taking warfarin and comes in today for a routine follow up visit.  Positive risk factors for bleeding include an age of 24 years or older and presence of serious comorbidities.  The bleeding index is 'intermediate risk'.  Positive CHADS2 values include History of CHF, History of HTN, Age > 27 years old, and History of Diabetes.  The start date was 10/18/2002.  His last INR was 2.2.  Anticoagulation responsible provider: Andee Lineman MD, Michelle Piper.  INR POC: 3.1.  Cuvette Lot#: [id number].  Exp: 10/11.    Anticoagulation Management Assessment/Plan:      The patient's current anticoagulation dose is Warfarin sodium 5 mg tabs: Take 1-2 tablet by mouth once a day.  The target INR is 2 - 3.  The next INR is due 11/25/2009.  Anticoagulation instructions were given to patient.  Results were reviewed/authorized by Vashti Hey, RN.  He was notified by Vashti Hey RN.         Prior Anticoagulation Instructions: INR 3.4 Take coumadin 2.5mg  tonight then decrease dose to 5mg  once daily except 2.5mg  on M,W,F  Current Anticoagulation Instructions: INR 3.1 Continue coumadin 5mg  once daily except 2.5mg  on M,W,F

## 2010-02-19 NOTE — Medication Information (Signed)
Summary: ccr-lr  Anticoagulant Therapy  Managed by: Vashti Hey, RN Referring MD: Andee Lineman PCP: Dr. Ardeen Garland Supervising MD: Diona Browner MD, Remi Deter Indication 1: Atrial Fibrillation (ICD-427.31) Lab Used: [facility] [place] Site: [facility] Clinic INR POC 3.0  Dietary changes: no    Health status changes: no    Bleeding/hemorrhagic complications: no    Recent/future hospitalizations: no    Any changes in medication regimen? no    Recent/future dental: no  Any missed doses?: no       Is patient compliant with meds? yes       Allergies: 1)  ! Pcn 2)  ! Celebrex 3)  ! Clarene Essex  Anticoagulation Management History:      The patient is taking warfarin and comes in today for a routine follow up visit.  Positive risk factors for bleeding include an age of 75 years or older and presence of serious comorbidities.  The bleeding index is 'intermediate risk'.  Positive CHADS2 values include History of CHF, History of HTN, Age > 50 years old, and History of Diabetes.  The start date was 10/18/2002.  His last INR was 2.2.  Anticoagulation responsible provider: Diona Browner MD, Remi Deter.  INR POC: 3.0.  Cuvette Lot#: [id number].  Exp: 10/11.    Anticoagulation Management Assessment/Plan:      The patient's current anticoagulation dose is Warfarin sodium 5 mg tabs: Take 1-2 tablet by mouth once a day.  The target INR is 2 - 3.  The next INR is due 12/23/2009.  Anticoagulation instructions were given to patient.  Results were reviewed/authorized by Vashti Hey, RN.         Prior Anticoagulation Instructions: INR 3.1 Continue coumadin 5mg  once daily except 2.5mg  on M,W,F  Current Anticoagulation Instructions: INR 3.0 Continue coumadin 5mg  once daily except 2.5mg  on M,W,F

## 2010-02-19 NOTE — Progress Notes (Signed)
Summary: 9562 fracture  Phone Note Outgoing Call Call back at [facility] Phone [phone]   Call placed by: Gypsy Balsam RN BSN,  March 25, 2009 8:48 PM Summary of Call: Called and spoke to patients wife around 6:15PM this evening.  Recieved patient's Carelink transmission which showed fractured 6949 lead.  Pt has not yet recieved any shocks.  He had 1 aborted VF therapy for noise on 3-5, 573 NST episodes, and 5177 SIC.  RV impedence trend also shows fractured lead.  Pt advised to go to [facility] (a mile from patients home) and then we would transfer to [facility] for RV lead revision.  Dr Andee Lineman called ER MD at [facility] and advised to place magnet over device and monitor rhythm.  Dr Johney Frame arranged transport via Carelink to [facility] this evening.  Jason with [employer] aware of patient's situation.  Pt on board for RV lead revision 3-10 with Dr Graciela Husbands. Gypsy Balsam RN BSN  March 25, 2009 8:50 PM

## 2010-02-19 NOTE — Procedures (Signed)
Summary: 1145/mdt icd   Current Medications (verified): 1)  Amiodarone Hcl 200 Mg Tabs (Amiodarone Hcl) .... Take 1/2 Tablet (100mg ) Daily 2)  Isosorbide Mononitrate Cr 60 Mg Xr24h-Tab (Isosorbide Mononitrate) .... Take 1 Tablet By Mouth Once A Day 3)  Lasix 80 Mg Tabs (Furosemide) .... One and A Half By Mouth Two Times A Day 4)  Lipitor 10 Mg Tabs (Atorvastatin Calcium) .... Take 1 Tablet By Mouth Once A Day 5)  Toprol Xl 25 Mg Xr24h-Tab (Metoprolol Succinate) .... Take 1 Tablet By Mouth Once A Day 6)  Warfarin Sodium 5 Mg Tabs (Warfarin Sodium) .... Take 1-2 Tablet By Mouth Once A Day 7)  Avapro 150 Mg Tabs (Irbesartan) .... Take 1 Tablet By Mouth Once A Day 8)  Aspirin 81 Mg Tbec (Aspirin) .... Take 1 Tablet By Mouth Once A Day 9)  Digoxin 0.125 Mg Tabs (Digoxin) .... Take 1 Tablet By Mouth Every Other Day 10)  Omeprazole 20 Mg Cpdr (Omeprazole) .... Take 1 Tablet By Mouth Once A Day 11)  Cymbalta 60 Mg Cpep (Duloxetine Hcl) .... Take 1 Tablet By Mouth Once A Day 12)  Renal Multivitamin/zinc  Tabs (Multiple Vitamin) .... Take 1 Tablet By Mouth Once A Day 13)  Neurontin 100 Mg Caps (Gabapentin) .... Take 1 Tablet By Mouth Once A Day 14)  Synthroid 100 Mcg Tabs (Levothyroxine Sodium) .... One By Mouth Daily 15)  Tylenol Extra Strength 500 Mg Tabs (Acetaminophen) .... 2 By Mouth Two Times A Day 16)  Valium 5 Mg Tabs (Diazepam) .... One By Mouth Three Times A Day 17)  Vitamin D 16109 Unit Tabs (Cholecalciferol) .... One By Mouth Weekly 18)  Humulin N Pen 100 Unit/ml Susp (Insulin Isophane Human) .... 55 Units Two Times A Day 19)  Humulin R 100 Unit/ml Soln (Insulin Regular Human) .... 7-10 Units Two Times A Day 20)  Allergy Relief 10 Mg Tabs (Loratadine) .... Take 1 Tablet By Mouth Once A Day 21)  Flonase 50 Mcg/act Susp (Fluticasone Propionate) .... 2 Sprays/nostrils Daily 22)  Ms Contin 30 Mg Xr12h-Tab (Morphine Sulfate) .... Take 1 Tablet By Mouth Three Times A Day  Allergies: 1)  !  Pcn 2)  ! Celebrex 3)  ! * Ambien   ICD Specifications Following MD:  Sherryl Manges, MD     ICD Vendor:  Medtronic     ICD Model Number:  7299     ICD Serial Number:  [id number] ICD DOI:  09/04/2004     ICD Implanting MD:  Sherryl Manges, MD  Lead 1:    Location: RA     DOI: 09/04/2004     Model #: 1914     Serial #: [id number]     Status: active Lead 2:    Location: RV     DOI: 09/04/2004     Model #: 8657     Serial #: [id number]     Status: active Lead 3:    Location: LV     DOI: 09/04/2004     Model #: 8413     Serial #: [id number]     Status: active  Indications::  CHF, NICM   ICD Follow Up Battery Voltage:  3.15 V     Charge Time:  8.4 seconds     Underlying rhythm:  SR ICD Dependent:  No       ICD Device Measurements Atrium:  Amplitude: 1.9 mV, Impedance: 551 ohms, Threshold: 1.00 V at 0.40 msec Right Ventricle:  Amplitude: 20 mV,  Impedance: 532 ohms, Threshold: 0.50 V at 0.60 msec Left Ventricle:  Impedance: 684 ohms, Threshold: 1.25 V at 0.40 msec Shock Impedance: 190/150 ohms   Episodes MS Episodes:  10     Percent Mode Switch:  1.5%     Coumadin:  Yes Shock:  0     ATP:  0     Nonsustained:  0     Atrial Therapies:  0 Atrial Pacing:  2.5%     Ventricular Pacing:  99.2%  Brady Parameters Mode DDD     Lower Rate Limit:  50     Upper Rate Limit 130 PAV 190     Sensed AV Delay:  160  Tachy Zones VF:  200     VT:  FVT VIA VF 250     VT1:  176     Next Cardiology Appt Due:  04/13/2010 Tech Comments:  LAST CHECK ICD THERAPIES TURNED OFF.  PT STILL HEAR TONE DUE TO CARELINK MONITOR BEING TURNED ON.  TURNED OFF CARELINK MONITOR.  NORMAL DEVICE FUNCTION.  ROV 04-13-10 @ 1445 W/JA IN [facility]. [name]  January 07, 2010 12:09 PM

## 2010-02-19 NOTE — Medication Information (Signed)
Summary: ccr-lr  Anticoagulant Therapy  Managed by: Vashti Hey, RN Referring MD: Andee Lineman PCP: Dr. Ardeen Garland Supervising MD: Diona Browner MD, Remi Deter Indication 1: Atrial Fibrillation (ICD-427.31) [facility] [place] Site: [facility] INR POC 2.7  Dietary changes: no    Health status changes: no    Bleeding/hemorrhagic complications: no    Recent/future hospitalizations: no    Any changes in medication regimen? no    Recent/future dental: no  Any missed doses?: no       Is patient compliant with meds? yes       Allergies: 1)  ! Pcn 2)  ! Celebrex 3)  ! Clarene Essex  Anticoagulation Management History:      The patient is taking warfarin and comes in today for a routine follow up visit.  Positive risk factors for bleeding include an age of 49 years or older and presence of serious comorbidities.  The bleeding index is 'intermediate risk'.  Positive CHADS2 values include History of CHF, History of HTN, Age > 69 years old, and History of Diabetes.  The start date was 10/18/2002.  His last INR was 2.2.  Anticoagulation responsible provider: Diona Browner MD, Remi Deter.  INR POC: 2.7.  Cuvette Lot#: [id number].  Exp: 10/11.    Anticoagulation Management Assessment/Plan:      The patient's current anticoagulation dose is Warfarin sodium 5 mg tabs: Take 1-2 tablet by mouth once a day.  The target INR is 2 - 3.  The next INR is due 05/16/2009.  Anticoagulation instructions were given to patient.  Results were reviewed/authorized by Vashti Hey, RN.  He was notified by Vashti Hey RN.         Prior Anticoagulation Instructions: INR 2.9 Continue coumadin 2.5mg  once daily except 5mg  on S,T,Th  Current Anticoagulation Instructions: INR 2.7 Continue coumadin 2.5mg  once daily except 5mg  on S,T,Th

## 2010-02-19 NOTE — Medication Information (Signed)
Summary: ccr-lr  Anticoagulant Therapy  Managed by: Vashti Hey, RN Referring MD: Andee Lineman PCP: Dr. Ardeen Garland Supervising MD: Antoine Poche MD, Fayrene Fearing Indication 1: Atrial Fibrillation (ICD-427.31) Lab [facility] [place] Site: [facility] INR POC 2.8  Dietary changes: no    Health status changes: no    Bleeding/hemorrhagic complications: no    Recent/future hospitalizations: no    Any changes in medication regimen? no    Recent/future dental: no  Any missed doses?: no       Is patient compliant with meds? yes       Allergies: 1)  ! Pcn 2)  ! Celebrex 3)  ! Clarene Essex  Anticoagulation Management History:      The patient is taking warfarin and comes in today for a routine follow up visit.  Positive risk factors for bleeding include an age of 75 years or older and presence of serious comorbidities.  The bleeding index is 'intermediate risk'.  Positive CHADS2 values include History of CHF, History of HTN, Age > 46 years old, and History of Diabetes.  The start date was 10/18/2002.  His last INR was 2.2.  Anticoagulation responsible provider: Antoine Poche MD, Fayrene Fearing.  INR POC: 2.8.  Cuvette Lot#: [id number].  Exp: 10/11.    Anticoagulation Management Assessment/Plan:      The patient's current anticoagulation dose is Warfarin sodium 5 mg tabs: Take 1-2 tablet by mouth once a day.  The target INR is 2 - 3.  The next INR is due 01/20/2010.  Anticoagulation instructions were given to patient.  Results were reviewed/authorized by Vashti Hey, RN.  He was notified by Vashti Hey RN.         Prior Anticoagulation Instructions: INR 3.0 Continue coumadin 5mg  once daily except 2.5mg  on M,W,F  Current Anticoagulation Instructions: INR 2.8 Continue coumadin 5mg  once daily except 2.5mg  on M,W,F

## 2010-02-19 NOTE — Medication Information (Signed)
Summary: ccr-lr  Anticoagulant Therapy  Managed by: Vashti Hey, RN Referring MD: Andee Lineman PCP: Dr. Ardeen Garland Supervising MD: Diona Browner MD, Remi Deter Indication 1: Atrial Fibrillation (ICD-427.31) Lab [facility] [place] Site: [facility] INR POC 3.1  Dietary changes: no    Health status changes: no    Bleeding/hemorrhagic complications: yes       Details: bruise above lt breast    fell   Morphine decreased by Dr Vear Clock  Recent/future hospitalizations: no    Any changes in medication regimen? no    Recent/future dental: no  Any missed doses?: no       Is patient compliant with meds? yes       Allergies: 1)  ! Pcn 2)  ! Celebrex 3)  ! Clarene Essex  Anticoagulation Management History:      The patient is taking warfarin and comes in today for a routine follow up visit.  Positive risk factors for bleeding include an age of 75 years or older and presence of serious comorbidities.  The bleeding index is 'intermediate risk'.  Positive CHADS2 values include History of CHF, History of HTN, Age > 58 years old, and History of Diabetes.  The start date was 10/18/2002.  His last INR was 2.2.  Anticoagulation responsible provider: Diona Browner MD, Remi Deter.  INR POC: 3.1.  Cuvette Lot#: [id number].  Exp: 10/11.    Anticoagulation Management Assessment/Plan:      The patient's current anticoagulation dose is Warfarin sodium 5 mg tabs: Take 1-2 tablet by mouth once a day.  The target INR is 2 - 3.  The next INR is due 03/21/2009.  Anticoagulation instructions were given to patient.  Results were reviewed/authorized by Vashti Hey, RN.  He was notified by Vashti Hey RN.         Prior Anticoagulation Instructions: INR 2.7 Continue coumadin 5mg  once daily except 2.5mg  on M,W,F  Current Anticoagulation Instructions: INR 3.1 Decrease dose to 2.5mg  once daily except 5mg  on Sundays, Tuesdays and Thursdays

## 2010-02-19 NOTE — Medication Information (Signed)
Summary: ccr-lr  Anticoagulant Therapy  Managed by: Vashti Hey, RN Referring MD: Andee Lineman PCP: Dr. Ardeen Garland Supervising MD: Diona Browner MD, Remi Deter Indication 1: Atrial Fibrillation (ICD-427.31) Lab [facility] [place] Site: [facility] Clinic INR POC 2.3  Dietary changes: no    Health status changes: no    Bleeding/hemorrhagic complications: no    Recent/future hospitalizations: no    Any changes in medication regimen? no    Recent/future dental: no  Any missed doses?: no       Is patient compliant with meds? yes       Allergies: 1)  ! Pcn 2)  ! Celebrex 3)  ! Clarene Essex  Anticoagulation Management History:      The patient is taking warfarin and comes in today for a routine follow up visit.  Positive risk factors for bleeding include an age of 75 years or older and presence of serious comorbidities.  The bleeding index is 'intermediate risk'.  Positive CHADS2 values include History of CHF, History of HTN, Age > 42 years old, and History of Diabetes.  The start date was 10/18/2002.  His last INR was 2.2.  Anticoagulation responsible provider: Diona Browner MD, Remi Deter.  INR POC: 2.3.  Cuvette Lot#: [id number].  Exp: 10/11.    Anticoagulation Management Assessment/Plan:      The patient's current anticoagulation dose is Warfarin sodium 5 mg tabs: Take 1-2 tablet by mouth once a day.  The target INR is 2 - 3.  The next INR is due 06/17/2009.  Anticoagulation instructions were given to patient.  Results were reviewed/authorized by Vashti Hey, RN.  He was notified by Vashti Hey RN.         Prior Anticoagulation Instructions: INR 2.7 Continue coumadin 2.5mg  once daily except 5mg  on S,T,Th  Current Anticoagulation Instructions: INR 2.3 Continue coumadin 2.5mg  once daily except 5mg  on S,T,Th

## 2010-02-19 NOTE — Progress Notes (Signed)
  Phone Note Call from Patient   Caller: [name], Wife Reason for Call: Talk to Nurse Summary of Call: Every three months readings are sent to [facility] for [facility] device.  It is time to do so but the machine that takes the readings is not working. [name] needs a new machine.  He currently has a new defibulator.  Call [name] at [phone]. Initial call taken by: Alexis Goodell  Additional Follow-up for Phone Call Additional follow up Details #1::        spoke w/[name]--changed device information in carelink.  Carelink to send new transmitter to pt and return box for the old transmitter. Pt aware. [name]  September 26, 2009 9:25 AM

## 2010-02-19 NOTE — Procedures (Signed)
Summary: icd check/medtronic   Current Medications (verified): 1)  Amiodarone Hcl 200 Mg Tabs (Amiodarone Hcl) .... Take 1/2 Tablet (100mg ) Daily 2)  Isosorbide Mononitrate Cr 60 Mg Xr24h-Tab (Isosorbide Mononitrate) .... Take 1 Tablet By Mouth Once A Day 3)  Lasix 80 Mg Tabs (Furosemide) .... One and A Half By Mouth Two Times A Day 4)  Lipitor 10 Mg Tabs (Atorvastatin Calcium) .... Take 1 Tablet By Mouth Once A Day 5)  Toprol Xl 25 Mg Xr24h-Tab (Metoprolol Succinate) .... Take 1 Tablet By Mouth Once A Day 6)  Warfarin Sodium 5 Mg Tabs (Warfarin Sodium) .... Take 1-2 Tablet By Mouth Once A Day 7)  Avapro 150 Mg Tabs (Irbesartan) .... Take 1 Tablet By Mouth Once A Day 8)  Aspirin 81 Mg Tbec (Aspirin) .... Take 1 Tablet By Mouth Once A Day 9)  Digoxin 0.125 Mg Tabs (Digoxin) .... Take 1 Tablet By Mouth Every Other Day 10)  Omeprazole 20 Mg Cpdr (Omeprazole) .... Take 1 Tablet By Mouth Once A Day 11)  Cymbalta 60 Mg Cpep (Duloxetine Hcl) .... Take 1 Tablet By Mouth Once A Day 12)  Renal Multivitamin/zinc  Tabs (Multiple Vitamin) .... Take 1 Tablet By Mouth Once A Day 13)  Neurontin 100 Mg Caps (Gabapentin) .... Take 1 Tablet By Mouth Once A Day 14)  Synthroid 100 Mcg Tabs (Levothyroxine Sodium) .... One By Mouth Daily 15)  Tylenol Extra Strength 500 Mg Tabs (Acetaminophen) .... 2 By Mouth Two Times A Day 16)  Valium 5 Mg Tabs (Diazepam) .... One By Mouth Three Times A Day 17)  Vitamin D 16109 Unit Tabs (Cholecalciferol) .... One By Mouth Weekly 18)  Humulin N Pen 100 Unit/ml Susp (Insulin Isophane Human) .... 55 Units Two Times A Day 19)  Humulin R 100 Unit/ml Soln (Insulin Regular Human) .... 7-10 Units Two Times A Day 20)  Allergy Relief 10 Mg Tabs (Loratadine) .... Take 1 Tablet By Mouth Once A Day 21)  Flonase 50 Mcg/act Susp (Fluticasone Propionate) .... 2 Sprays/nostrils Daily 22)  Ms Contin 30 Mg Xr12h-Tab (Morphine Sulfate) .... Take 1 Tablet By Mouth Three Times A Day  Allergies  (verified): 1)  ! Pcn 2)  ! Celebrex 3)  ! Clarene Essex    ICD Specifications Following MD:  Sherryl Manges, MD     ICD Vendor:  Medtronic     ICD Model Number:  [phone]     ICD Serial Number:  [id number] ICD DOI:  09/04/2004     ICD Implanting MD:  Sherryl Manges, MD  Lead 1:    Location: RA     DOI: 09/04/2004     Model #: 7829     Serial #: [id number]     Status: active Lead 2:    Location: RV     DOI: 09/04/2004     Model #: 8469     Serial #: [id number]     Status: active Lead 3:    Location: LV     DOI: 09/04/2004     Model #: 2440     Serial #: [id number]     Status: active  Indications::  CHF, NICM   ICD Follow Up Battery Voltage:  3.17 V     Charge Time:  8.4 seconds     Underlying rhythm:  SR ICD Dependent:  No       ICD Device Measurements Atrium:  Amplitude: 1.8 mV, Impedance: 532 ohms, Threshold: 0.50 V at 0.40 msec Right Ventricle:  Amplitude:  20 mV, Impedance: 532 ohms, Threshold: 0.75 V at 0.60 msec Left Ventricle:  Impedance: 684 ohms, Threshold: 2.00 V at 0.40 msec Shock Impedance: 164/104 ohms   Episodes MS Episodes:  154     Percent Mode Switch:  1.8%     Coumadin:  Yes Shock:  0     ATP:  0     Nonsustained:  1     Atrial Therapies:  0 Atrial Pacing:  1.2%     Ventricular Pacing:  99.2%  Brady Parameters Mode DDD     Lower Rate Limit:  50     Upper Rate Limit 130 PAV 190     Sensed AV Delay:  160  Tachy Zones VF:  200     VT:  FVT VIA VF 250     VT1:  176     Next Cardiology Appt Due:  01/07/2010 Tech Comments:  PT LEAD IMPEDANCE ALERTS WENT OFF 12-13-09.  PT PRESENTED TO [facility] AND WAS SENT HOME.  PT SENT CARELINK TRANSMISSION. PT SCHEDULED FOR OV FOR TODAY.  RV/SVC/LV LEAD IMPEDANCES OUT OF RANGE.  RV 158 SVC 184 AND LV >3000.  TESTS RUN AT CHECK SHOWED RV 105 SVC 103 AND LV 551. LV THRESHOLD 2.00 @ 0.40 LVTIP TO RVCOIL.  DR Johney Frame WAS INFORMED AND SPOKE W/PT.  IT WAS DECIDED TO TURN OFF THERAPIES ON DEVICE AND CHANGE LV CONFIGURATION TO TO LVTIP TO LVRING.   ALERTS WERE TURNED OFF ON RV % SVC LEAD IMPEDANCES.   LV OUTPUT CHANGED FROM 2.25 TO 3.00 V.  ROV 01-07-10 @ 1145 IN [facility] [facility]. [name]  December 15, 2009 11:10 AM MD Comments:  agree with above It appears that the SVC and RV coil portions of the patients Sprint Fidelis lead have now failed.  Given his advanced age, oxygen dependance, and dementia, I have advised that we turn the ICD detections off and continue biventricular pacing.  The patient and his spouse are in complete agreement with this plan.  They understand that biventricular pacing will help preserve quality of life.  They understand that he will not have ICD shock therapy backup.  They are clear in their decision to avoid further lead revision and agree with this plan.  LV lead is reprogrammed bipolar for pacing.   Patient Instructions: 1)  Appointment 01-07-10 @ 1145 in [place] office

## 2010-02-19 NOTE — Letter (Signed)
Summary: Remote Device Check  [employer], Main Office  [address]   [place], [place] [address]   Phone: [phone]  Fax: [phone]     October 29, 2009 MRN: [id number]   [name] [address] [place], [place]  [address]   Dear [name],   Your remote transmission was recieved and reviewed by your physician.  All diagnostics were within normal limits for you.  __X___Your next transmission is scheduled for:   01-08-2010.  Please transmit at any time this day.  If you have a wireless device your transmission will be sent automatically.   Sincerely,  [name]

## 2010-02-19 NOTE — Cardiovascular Report (Signed)
Summary: Office Visit Remote   Office Visit Remote   Imported By: Roderic Ovens 04/07/2009 14:02:26  _____________________________________________________________________  External Attachment:    Type:   Image     Comment:   External Document

## 2010-02-19 NOTE — Procedures (Signed)
Summary: WOUND CHECK   Current Medications (verified): 1)  Amiodarone Hcl 200 Mg Tabs (Amiodarone Hcl) .Marland Kitchen.. 1 Tablet By Mouth Once A Day 2)  Isosorbide Mononitrate Cr 60 Mg Xr24h-Tab (Isosorbide Mononitrate) .... Take 1 Tablet By Mouth Once A Day 3)  Lasix 80 Mg Tabs (Furosemide) .... One and A Half By Mouth Two Times A Day 4)  Lipitor 10 Mg Tabs (Atorvastatin Calcium) .... Take 1 Tablet By Mouth Once A Day 5)  Toprol Xl 25 Mg Xr24h-Tab (Metoprolol Succinate) .... Take 1 Tablet By Mouth Once A Day 6)  Warfarin Sodium 5 Mg Tabs (Warfarin Sodium) .... Take 1-2 Tablet By Mouth Once A Day 7)  Avapro 150 Mg Tabs (Irbesartan) .... Take 1 Tablet By Mouth Once A Day 8)  Morphine Sulfate 30 Mg Tabs (Morphine Sulfate) .... 2 Tabs Two Times A Day 9)  Aspirin 81 Mg Tbec (Aspirin) .... Take 1 Tablet By Mouth Once A Day 10)  Digoxin 0.125 Mg Tabs (Digoxin) .... Take 1 Tablet By Mouth Once A Day 11)  Omeprazole 20 Mg Cpdr (Omeprazole) .... Take 1 Tablet By Mouth Once A Day 12)  Cymbalta 60 Mg Cpep (Duloxetine Hcl) .... Take 1 Tablet By Mouth Once A Day 13)  Renal Multivitamin/zinc  Tabs (Multiple Vitamin) .... Take 1 Tablet By Mouth Once A Day 14)  Neurontin 100 Mg Caps (Gabapentin) .... Take 1 Tablet By Mouth Once A Day 15)  Synthroid 100 Mcg Tabs (Levothyroxine Sodium) .... One By Mouth Daily 16)  Tylenol Extra Strength 500 Mg Tabs (Acetaminophen) .... 2 By Mouth Two Times A Day 17)  Valium 5 Mg Tabs (Diazepam) .... One By Mouth Three Times A Day 18)  Vitamin D 16109 Unit Tabs (Cholecalciferol) .... One By Mouth Weekly 19)  Ms Contin 30 Mg Xr12h-Tab (Morphine Sulfate) .... One By Mouth Three Times A Day 20)  Humulin N Pen 100 Unit/ml Susp (Insulin Isophane Human) .... 55 Units Two Times A Day 21)  Humulin R 100 Unit/ml Soln (Insulin Regular Human) .... 7-10 Units Two Times A Day  Allergies (verified): 1)  ! Pcn 2)  ! Celebrex 3)  ! Clarene Essex   ICD Specifications Following MD:  Sherryl Manges, MD      ICD Vendor:  Medtronic     ICD Model Number:  [phone]     ICD Serial Number:  [id number] ICD DOI:  09/04/2004     ICD Implanting MD:  Sherryl Manges, MD  Lead 1:    Location: RA     DOI: 09/04/2004     Model #: 7829     Serial #: [id number]     Status: active Lead 2:    Location: RV     DOI: 09/04/2004     Model #: 8469     Serial #: [id number]     Status: active Lead 3:    Location: LV     DOI: 09/04/2004     Model #: 2440     Serial #: [id number]     Status: active  Indications::  CHF, NICM   ICD Follow Up Remote Check?  No Battery Voltage:  3.22 V     Charge Time:  8.5 seconds     Underlying rhythm:  SR ICD Dependent:  No       ICD Device Measurements Atrium:  Amplitude: 1.8 mV, Impedance: 532 ohms, Threshold: 1.0 V at 0.4 msec Right Ventricle:  Amplitude: 20 mV, Impedance: 703 ohms, Threshold: 1.0 V at 0.4  msec Left Ventricle:  Impedance: 494 ohms, Threshold: 0.75 V at 0.4 msec Shock Impedance: 53/69 ohms   Episodes MS Episodes:  0     Percent Mode Switch:  0     Coumadin:  No Shock:  0     ATP:  0     Nonsustained:  0     Atrial Pacing:  0.2%     Ventricular Pacing:  99.9%  Brady Parameters Mode DDD     Lower Rate Limit:  50     Upper Rate Limit 130 PAV 190     Sensed AV Delay:  160  Tachy Zones VF:  200     VT:  FVT VIA VF 250     VT1:  176     Next Cardiology Appt Due:  06/18/2009 Tech Comments:  No parameter changes.  Device function normal.  Steri strips removed by the patients, no edema but some redness from irritation of the tape.  ROV 3 months with Dr. Johney Frame in [place]. Altha Harm, LPN  April 11, 2009 11:21 AM

## 2010-02-19 NOTE — Assessment & Plan Note (Signed)
Summary: 6 mon ful fholt   Visit Type:  Follow-up Primary Provider:  Dr. Ardeen Garland   History of Present Illness: the patient is 77 year old malehistory of ischemic heart disease, occluded RCA with left to right collaterals hypertension, hyperlipidemia COPD and poorly controlled diabetesthe patient is status post CRT D. He has history of a fibrillation but is in normal sinus rhythm on amiodarone. He reports no discharges. He reports no palpitations. The patient is limited in his functional activity due to chronic arthritis.he denies in her cognitive PND palpitations or syncope. EKG today reveals normal sinus rhythm with ventricular pacing. TSH on amiodarone was also in normal limits as well as liver function test. LDL stable at 54  Preventive Screening-Counseling & Management  Alcohol-Tobacco     Smoking Status: quit     Year Quit: 1996  Current Medications (verified): 1)  Amiodarone Hcl 200 Mg Tabs (Amiodarone Hcl) .... Take 1/2 Tablet (100mg ) Daily 2)  Isosorbide Mononitrate Cr 60 Mg Xr24h-Tab (Isosorbide Mononitrate) .... Take 1 Tablet By Mouth Once A Day 3)  Lasix 80 Mg Tabs (Furosemide) .... One and A Half By Mouth Two Times A Day 4)  Lipitor 10 Mg Tabs (Atorvastatin Calcium) .... Take 1 Tablet By Mouth Once A Day 5)  Toprol Xl 25 Mg Xr24h-Tab (Metoprolol Succinate) .... Take 1 Tablet By Mouth Once A Day 6)  Warfarin Sodium 5 Mg Tabs (Warfarin Sodium) .... Take 1-2 Tablet By Mouth Once A Day 7)  Avapro 150 Mg Tabs (Irbesartan) .... Take 1 Tablet By Mouth Once A Day 8)  Aspirin 81 Mg Tbec (Aspirin) .... Take 1 Tablet By Mouth Once A Day 9)  Digoxin 0.125 Mg Tabs (Digoxin) .... Take 1 Tablet By Mouth Every Other Day 10)  Omeprazole 20 Mg Cpdr (Omeprazole) .... Take 1 Tablet By Mouth Once A Day 11)  Cymbalta 60 Mg Cpep (Duloxetine Hcl) .... Take 1 Tablet By Mouth Once A Day 12)  Renal Multivitamin/zinc  Tabs (Multiple Vitamin) .... Take 1 Tablet By Mouth Once A Day 13)  Neurontin 100  Mg Caps (Gabapentin) .... Take 1 Tablet By Mouth Once A Day 14)  Synthroid 100 Mcg Tabs (Levothyroxine Sodium) .... One By Mouth Daily 15)  Tylenol Extra Strength 500 Mg Tabs (Acetaminophen) .... 2 By Mouth Two Times A Day 16)  Valium 5 Mg Tabs (Diazepam) .... One By Mouth Three Times A Day 17)  Vitamin D [address] Unit Tabs (Cholecalciferol) .... One By Mouth Weekly 18)  Humulin N Pen 100 Unit/ml Susp (Insulin Isophane Human) .... 55 Units Two Times A Day 19)  Humulin R 100 Unit/ml Soln (Insulin Regular Human) .... 7-10 Units Two Times A Day 20)  Allergy Relief 10 Mg Tabs (Loratadine) .... Take 1 Tablet By Mouth Once A Day 21)  Flonase 50 Mcg/act Susp (Fluticasone Propionate) .... 2 Sprays/nostrils Daily 22)  Ms Contin 30 Mg Xr12h-Tab (Morphine Sulfate) .... Take 1 Tablet By Mouth Three Times A Day  Allergies (verified): 1)  ! Pcn 2)  ! Celebrex 3)  ! Clarene Essex  Comments:  Nurse/Medical Assistant: The patient's medication list and allergies were reviewed with the patient and were updated in the Medication and Allergy Lists.  Past History:  Past Medical History: Last updated: 11/21/2008 SYSTOLIC HEART FAILURE, CHRONIC (ICD-428.22) (EF was 20%, 50% echo 2007) ICD - IN SITU (ICD-V45.02) CARDIOMYOPATHY, ISCHEMIC (ICD-414.8) ATRIAL FIBRILLATION (ICD-427.31) MITRAL REGURGITATION, 0 (MILD) (ICD-396.3) CAD, UNSPECIFIED SITE (ICD-414.00)Coronary artery disease, status post multiple percutaneous coronary nterventions in the past.  Last cardiac catheterization in July 2006:  Totally occluded RCA with left-to-right     collaterals, and a 70-75% mid-to-distal obtuse marginal stenosis - medical therapy.) HYPERTENSION, UNSPECIFIED (ICD-401.9) HYPERLIPIDEMIA-MIXED (ICD-272.4) COPD (ICD-496) RENAL DISEASE, CHRONIC (ICD-593.9) GERD (ICD-530.81) DIABETES MELLITUS (ICD-250.00) SLEEP APNEA (ICD-780.57) ANEMIA (ICD-285.9)  Past Surgical History: Last updated: 11/21/2008 ICD - Medtronic InSync Sentry  7299 - 09/04/04 Cholecystectomy Cystoscopy for bladder CA years ago  Family History: Last updated: 05/05/2009 noncontributory  Social History: Last updated: 06/12/2008 Retired  Married  Tobacco Use - Former. - 1996 Alcohol Use - no Regular Exercise - no Drug Use - no  Risk Factors: Smoking Status: quit (10/14/2009)  Review of Systems       The patient complains of fatigue, weight gain/loss, decreased hearing, shortness of breath, sleep apnea, muscle weakness, joint pain, and skin lesions.  The patient denies malaise, fever, vision loss, hoarseness, chest pain, palpitations, prolonged cough, wheezing, coughing up blood, abdominal pain, blood in stool, nausea, vomiting, diarrhea, heartburn, incontinence, blood in urine, leg swelling, rash, headache, fainting, dizziness, depression, anxiety, enlarged lymph nodes, easy bruising or bleeding, and environmental allergies.    Vital Signs:  Patient profile:   77 year old male Height:      72 inches Weight:      232 pounds BMI:     31.58 Pulse rate:   80 / minute BP sitting:   118 / 70  (left arm) Cuff size:   large  Vitals Entered By: Carlye Grippe (October 14, 2009 2:04 PM)  Nutrition Counseling: Patient's BMI is greater than 25 and therefore counseled on weight management options.  Physical Exam  Additional Exam:  General: Well-developed, well-nourished in no distress head: Normocephalic and atraumatic eyes PERRLA/EOMI intact, conjunctiva and lids normal nose: No deformity or lesions mouth normal dentition, normal posterior pharynx neck: Supple, no JVD.  No masses, thyromegaly or abnormal cervical nodes lungs: decreased breath sounds bilaterally without wheezing.  Normal percussion heart: regular rate and rhythm with normal S1 and S2, no S3 or S4.  PMI is normal.  No pathological murmurs abdomen: Normal bowel sounds, abdomen is soft and nontender without masses, organomegaly or hernias noted.  No  hepatosplenomegaly musculoskeletal: Back normal, normal gait muscle strength and tone normal pulsus: Pulse is normal in all 4 extremities Extremities: No peripheral pitting edema neurologic: Alert and oriented x 3 skin: Intact without lesions or rashes cervical nodes: No significant adenopathy psychologic: Normal affect    EKG  Procedure date:  10/14/2009  Findings:      normal sinus rhythm. Ventricular pacing   ICD Specifications Following MD:  Sherryl Manges, MD     ICD Vendor:  [employer]onic     ICD Model Number:  7299     ICD Serial Number:  [id number] ICD DOI:  09/04/2004     ICD Implanting MD:  Sherryl Manges, MD  Lead 1:    Location: RA     DOI: 09/04/2004     Model #: 4098     Serial #: [id number]     Status: active Lead 2:    Location: RV     DOI: 09/04/2004     Model #: 6213     Serial #: [id number]     Status: active Lead 3:    Location: LV     DOI: 09/04/2004     Model #: [id number]     Serial #: [id number]     Status: active  Indications::  CHF, NICM   ICD Follow  Up ICD Dependent:  No      Episodes Coumadin:  Yes  Brady Parameters Mode DDD     Lower Rate Limit:  50     Upper Rate Limit 130 PAV 190     Sensed AV Delay:  160  Tachy Zones VF:  200     VT:  FVT VIA VF 250     VT1:  176     Impression & Recommendations:  Problem # 1:  SYSTOLIC HEART FAILURE, CHRONIC (ICD-428.22) no evidence of volume overload continue medical therapy His updated medication list for this problem includes:    Amiodarone Hcl 200 Mg Tabs (Amiodarone hcl) .Marland Kitchen... Take 1/2 tablet (100mg ) daily    Isosorbide Mononitrate Cr 60 Mg Xr24h-tab (Isosorbide mononitrate) .Marland Kitchen... Take 1 tablet by mouth once a day    Lasix 80 Mg Tabs (Furosemide) ..... One and a half by mouth two times a day    Toprol Xl 25 Mg Xr24h-tab (Metoprolol succinate) .Marland Kitchen... Take 1 tablet by mouth once a day    Warfarin Sodium 5 Mg Tabs (Warfarin sodium) .Marland Kitchen... Take 1-2 tablet by mouth once a day    Avapro 150 Mg Tabs (Irbesartan) .Marland Kitchen...  Take 1 tablet by mouth once a day    Aspirin 81 Mg Tbec (Aspirin) .Marland Kitchen... Take 1 tablet by mouth once a day    Digoxin 0.125 Mg Tabs (Digoxin) .Marland Kitchen... Take 1 tablet by mouth every other day  Problem # 2:  ICD - IN SITU (ICD-V45.02) no defibrillator discharges. The patient status post a recent ICD change with CRT-D earlier this year  Problem # 3:  ATRIAL FIBRILLATION (ICD-427.31) patient remains in normal sinus rhythm. Continue Coumadin His updated medication list for this problem includes:    Amiodarone Hcl 200 Mg Tabs (Amiodarone hcl) .Marland Kitchen... Take 1/2 tablet (100mg ) daily    Toprol Xl 25 Mg Xr24h-tab (Metoprolol succinate) .Marland Kitchen... Take 1 tablet by mouth once a day    Warfarin Sodium 5 Mg Tabs (Warfarin sodium) .Marland Kitchen... Take 1-2 tablet by mouth once a day    Aspirin 81 Mg Tbec (Aspirin) .Marland Kitchen... Take 1 tablet by mouth once a day    Digoxin 0.125 Mg Tabs (Digoxin) .Marland Kitchen... Take 1 tablet by mouth every other day  Problem # 4:  DIABETES MELLITUS (ICD-250.00) poorly controlled and followed by his primary care physician. His updated medication list for this problem includes:    Avapro 150 Mg Tabs (Irbesartan) .Marland Kitchen... Take 1 tablet by mouth once a day    Aspirin 81 Mg Tbec (Aspirin) .Marland Kitchen... Take 1 tablet by mouth once a day    Humulin N Pen 100 Unit/ml Susp (Insulin isophane human) .Marland KitchenMarland KitchenMarland KitchenMarland Kitchen 55 units two times a day    Humulin R 100 Unit/ml Soln (Insulin regular human) .Marland Kitchen... 7-10 units two times a day  Patient Instructions: 1)  Your physician recommends that you continue on your current medications as directed. Please refer to the Current Medication list given to you today. 2)  Follow up in  6 months

## 2010-02-19 NOTE — Medication Information (Signed)
Summary: ccr-lr  Anticoagulant Therapy  Managed by: Vashti Hey, RN Referring MD: Andee Lineman PCP: Dr. Ardeen Garland Supervising MD: Andee Lineman MD, Michelle Piper Indication 1: Atrial Fibrillation (ICD-427.31) [facility] [place] Site: [facility] INR POC 2.1  Dietary changes: no    Health status changes: no    Bleeding/hemorrhagic complications: no    Recent/future hospitalizations: no    Any changes in medication regimen? no    Recent/future dental: no  Any missed doses?: no       Is patient compliant with meds? yes       Allergies: 1)  ! Pcn 2)  ! Celebrex 3)  ! Clarene Essex  Anticoagulation Management History:      The patient is taking warfarin and comes in today for a routine follow up visit.  Positive risk factors for bleeding include an age of 75 years or older and presence of serious comorbidities.  The bleeding index is 'intermediate risk'.  Positive CHADS2 values include History of CHF, History of HTN, Age > 38 years old, and History of Diabetes.  The start date was 10/18/2002.  His last INR was 2.2.  Anticoagulation responsible provider: Andee Lineman MD, Michelle Piper.  INR POC: 2.1.  Cuvette Lot#: [id number].  Exp: 10/11.    Anticoagulation Management Assessment/Plan:      The patient's current anticoagulation dose is Warfarin sodium 5 mg tabs: Take 1-2 tablet by mouth once a day.  The target INR is 2 - 3.  The next INR is due 08/12/2009.  Anticoagulation instructions were given to patient.  Results were reviewed/authorized by Vashti Hey, RN.  He was notified by Vashti Hey RN.         Prior Anticoagulation Instructions: INR 2.8 Continue coumadin 2.5mg  once daily except 5mg  on S,T,Th  Current Anticoagulation Instructions: INR 2.1 Continue coumadin 2.5mg  once daily except 5mg  on S,T,Th

## 2010-02-19 NOTE — Medication Information (Signed)
Summary: ccr-lr  Anticoagulant Therapy  Managed by: Vashti Hey, RN Referring MD: Andee Lineman PCP: Dr. Ardeen Garland Supervising MD: Andee Lineman MD, Michelle Piper Indication 1: Atrial Fibrillation (ICD-427.31) Lab [facility] [place] Site: [facility] INR POC 2.9  Dietary changes: no    Health status changes: no    Bleeding/hemorrhagic complications: no    Recent/future hospitalizations: no    Any changes in medication regimen? no    Recent/future dental: no  Any missed doses?: no       Is patient compliant with meds? yes       Allergies: 1)  ! Pcn 2)  ! Celebrex 3)  ! Clarene Essex  Anticoagulation Management History:      The patient is taking warfarin and comes in today for a routine follow up visit.  Positive risk factors for bleeding include an age of 55 years or older and presence of serious comorbidities.  The bleeding index is 'intermediate risk'.  Positive CHADS2 values include History of CHF, History of HTN, Age > 17 years old, and History of Diabetes.  The start date was 10/18/2002.  His last INR was 2.2.  Anticoagulation responsible provider: Andee Lineman MD, Michelle Piper.  INR POC: 2.9.  Cuvette Lot#: [id number].  Exp: 10/11.    Anticoagulation Management Assessment/Plan:      The patient's current anticoagulation dose is Warfarin sodium 5 mg tabs: Take 1-2 tablet by mouth once a day.  The target INR is 2 - 3.  The next INR is due 04/18/2009.  Anticoagulation instructions were given to patient.  Results were reviewed/authorized by Vashti Hey, RN.  He was notified by Vashti Hey RN.         Prior Anticoagulation Instructions: INR 3.1 Decrease dose to 2.5mg  once daily except 5mg  on Sundays, Tuesdays and Thursdays  Current Anticoagulation Instructions: INR 2.9 Continue coumadin 2.5mg  once daily except 5mg  on S,T,Th

## 2010-02-19 NOTE — Letter (Signed)
Summary: [employer]inqu[employer] Journalist, newspaper, Main Office  [address]   [place], [place] [address]   Phone: [phone]  Fax: [phone]     September 26, 2009 MRN: [id number]   [name] [address] [place], [place]  [address]   Dear Mr. GRYGIEL,  According to our records, you were scheduled for a device phone transmission on 09-25-2009.     We did not receive any results from this check.  If you transmitted on your scheduled day, please call us to help troubleshoot your system.  If you forgot to send your transmission, please send one upon receipt of this letter.  Thank you,   Architectural technologist [facility]

## 2010-02-19 NOTE — Progress Notes (Signed)
Summary: [name]   Phone Note Call from Patient   Caller: Patient Call For: DOCTOR Summary of Call: while patient in office for inr check, patient asked MD for nasal spray. MD orderd flonase to be sent to [facility]. Initial call taken by: Carlye Grippe,  July 15, 2009 4:16 PM    New/Updated Medications: FLONASE 50 MCG/ACT SUSP (FLUTICASONE PROPIONATE) 2 sprays/nostrils daily Prescriptions: FLONASE 50 MCG/ACT SUSP (FLUTICASONE PROPIONATE) 2 sprays/nostrils daily  #1 bottle x 3   Entered by:   Carlye Grippe   Authorized by:   Lewayne Bunting, MD, [facility]   Signed by:   Carlye Grippe on 07/15/2009   Method used:   Electronically to        [facility]* (retail)       [address]       [place], [place]  [address]       Ph: [phone]       Fax: [phone]   RxID:   [id number]

## 2010-02-19 NOTE — Cardiovascular Report (Signed)
Summary: Office Visit Remote   Office Visit Remote   Imported By: Roderic Ovens 10/30/2009 11:00:18  _____________________________________________________________________  External Attachment:    Type:   Image     Comment:   External Document

## 2010-02-19 NOTE — Assessment & Plan Note (Signed)
Summary: EPH-PER RHONDA 1 MO   Visit Type:  Follow-up Primary Provider:  Dr. Ardeen Garland  CC:  follow-up visit.  History of Present Illness: the patient is a 77 year old male with history of coronary artery disease who was recently hospitalized for defibrillator lead fracture. The patient underwent insertion of a new rate sensing biventricular lead. He also underwent insertion of a new Medtronic Concerto biventricular ICD. He has underlying COPD. He also has chronic kidney disease stage III. He is on chronic Coumadin anticoagulation for history of atrial fibrillation. He denies any chest pain shortness of breath. He has no orthopnea PND. Pocket site is well-healed. He denies any defibrillator discharges. He denies any presyncope or syncope.   Preventive Screening-Counseling & Management  Alcohol-Tobacco     Smoking Status: quit     Year Quit: 1996  Current Medications (verified): 1)  Amiodarone Hcl 200 Mg Tabs (Amiodarone Hcl) .... Take 1/2 Tablet (100mg ) Daily 2)  Isosorbide Mononitrate Cr 60 Mg Xr24h-Tab (Isosorbide Mononitrate) .... Take 1 Tablet By Mouth Once A Day 3)  Lasix 80 Mg Tabs (Furosemide) .... One and A Half By Mouth Two Times A Day 4)  Lipitor 10 Mg Tabs (Atorvastatin Calcium) .... Take 1 Tablet By Mouth Once A Day 5)  Toprol Xl 25 Mg Xr24h-Tab (Metoprolol Succinate) .... Take 1 Tablet By Mouth Once A Day 6)  Warfarin Sodium 5 Mg Tabs (Warfarin Sodium) .... Take 1-2 Tablet By Mouth Once A Day 7)  Avapro 150 Mg Tabs (Irbesartan) .... Take 1 Tablet By Mouth Once A Day 8)  Aspirin 81 Mg Tbec (Aspirin) .... Take 1 Tablet By Mouth Once A Day 9)  Digoxin 0.125 Mg Tabs (Digoxin) .... Take 1 Tablet By Mouth Once A Day 10)  Omeprazole 20 Mg Cpdr (Omeprazole) .... Take 1 Tablet By Mouth Once A Day 11)  Cymbalta 60 Mg Cpep (Duloxetine Hcl) .... Take 1 Tablet By Mouth Once A Day 12)  Renal Multivitamin/zinc  Tabs (Multiple Vitamin) .... Take 1 Tablet By Mouth Once A Day 13)   Neurontin 100 Mg Caps (Gabapentin) .... Take 1 Tablet By Mouth Once A Day 14)  Synthroid 100 Mcg Tabs (Levothyroxine Sodium) .... One By Mouth Daily 15)  Tylenol Extra Strength 500 Mg Tabs (Acetaminophen) .... 2 By Mouth Two Times A Day 16)  Valium 5 Mg Tabs (Diazepam) .... One By Mouth Three Times A Day 17)  Vitamin D 41324 Unit Tabs (Cholecalciferol) .... One By Mouth Weekly 18)  Ms Contin 30 Mg Xr12h-Tab (Morphine Sulfate) .... One By Mouth Three Times A Day 19)  Humulin N Pen 100 Unit/ml Susp (Insulin Isophane Human) .... 55 Units Two Times A Day 20)  Humulin R 100 Unit/ml Soln (Insulin Regular Human) .... 7-10 Units Two Times A Day 21)  Allergy Relief 10 Mg Tabs (Loratadine) .... Take 1 Tablet By Mouth Once A Day  Allergies (verified): 1)  ! Pcn 2)  ! Celebrex 3)  ! Clarene Essex  Comments:  Nurse/Medical Assistant: The patient's medications and allergies were reviewed with the patient and were updated in the Medication and Allergy Lists. List reviewed.  Past History:  Past Medical History: Last updated: 11/21/2008 SYSTOLIC HEART FAILURE, CHRONIC (ICD-428.22) (EF was 20%, 50% echo 2007) ICD - IN SITU (ICD-V45.02) CARDIOMYOPATHY, ISCHEMIC (ICD-414.8) ATRIAL FIBRILLATION (ICD-427.31) MITRAL REGURGITATION, 0 (MILD) (ICD-396.3) CAD, UNSPECIFIED SITE (ICD-414.00)Coronary artery disease, status post multiple percutaneous coronary nterventions in the past. Last cardiac catheterization in July 2006:  Totally occluded RCA with left-to-right  collaterals, and a 70-75% mid-to-distal obtuse marginal stenosis - medical therapy.) HYPERTENSION, UNSPECIFIED (ICD-401.9) HYPERLIPIDEMIA-MIXED (ICD-272.4) COPD (ICD-496) RENAL DISEASE, CHRONIC (ICD-593.9) GERD (ICD-530.81) DIABETES MELLITUS (ICD-250.00) SLEEP APNEA (ICD-780.57) ANEMIA (ICD-285.9)  Past Surgical History: Last updated: 11/21/2008 ICD - Medtronic InSync Sentry 7299 - 09/04/04 Cholecystectomy Cystoscopy for bladder CA years  ago  Family History: Reviewed history and no changes required. noncontributory  Social History: Reviewed history from 06/12/2008 and no changes required. Retired  Married  Tobacco Use - Former. - 1996 Alcohol Use - no Regular Exercise - no Drug Use - no  Review of Systems       The patient complains of fatigue and shortness of breath.  The patient denies malaise, fever, weight gain/loss, vision loss, decreased hearing, hoarseness, chest pain, palpitations, prolonged cough, wheezing, sleep apnea, coughing up blood, abdominal pain, blood in stool, nausea, vomiting, diarrhea, heartburn, incontinence, blood in urine, muscle weakness, joint pain, leg swelling, rash, skin lesions, headache, fainting, dizziness, depression, anxiety, enlarged lymph nodes, easy bruising or bleeding, and environmental allergies.    Vital Signs:  Patient profile:   77 year old male Height:      72 inches Weight:      231 pounds Pulse rate:   76 / minute BP sitting:   161 / 75  (left arm) Cuff size:   large  Vitals Entered By: Carlye Grippe (May 05, 2009 2:38 PM) CC: follow-up visit   Physical Exam  Additional Exam:  General: Well-developed, well-nourished in no distress head: Normocephalic and atraumatic eyes PERRLA/EOMI intact, conjunctiva and lids normal nose: No deformity or lesions mouth normal dentition, normal posterior pharynx neck: Supple, no JVD.  No masses, thyromegaly or abnormal cervical nodes lungs: decreased breath sounds bilaterally without wheezing.  Normal percussion heart: regular rate and rhythm with normal S1 and S2, no S3 or S4.  PMI is normal.  No pathological murmurs abdomen: Normal bowel sounds, abdomen is soft and nontender without masses, organomegaly or hernias noted.  No hepatosplenomegaly musculoskeletal: Back normal, normal gait muscle strength and tone normal pulsus: Pulse is normal in all 4 extremities Extremities: No peripheral pitting edema neurologic: Alert and  oriented x 3 skin: Intact without lesions or rashes cervical nodes: No significant adenopathy psychologic: Normal affect     ICD Specifications Following MD:  Sherryl Manges, MD     ICD Vendor:  Medtronic     ICD Model Number:  7299     ICD Serial Number:  [id number] ICD DOI:  09/04/2004     ICD Implanting MD:  Sherryl Manges, MD  Lead 1:    Location: RA     DOI: 09/04/2004     Model #: [id number]     Serial #: [id number]     Status: active Lead 2:    Location: RV     DOI: 09/04/2004     Model #: [id number]     Serial #: [id number]     Status: active Lead 3:    Location: LV     DOI: 09/04/2004     Model #: [id number]     Serial #: [id number]     Status: active  Indications::  CHF, NICM   ICD Follow Up ICD Dependent:  No      Episodes Coumadin:  No  Brady Parameters Mode DDD     Lower Rate Limit:  50     Upper Rate Limit 130 PAV 190     Sensed AV Delay:  160  Tachy Zones VF:  200     VT:  FVT VIA VF 250     VT1:  176     Impression & Recommendations:  Problem # 1:  SYSTOLIC HEART FAILURE, CHRONIC (ICD-428.22) patient has no evidence of volume overload. He will continue on current medical regimen. His updated medication list for this problem includes:    Amiodarone Hcl 200 Mg Tabs (Amiodarone hcl) .Marland Kitchen... Take 1/2 tablet (100mg ) daily    Isosorbide Mononitrate Cr 60 Mg Xr24h-tab (Isosorbide mononitrate) .Marland Kitchen... Take 1 tablet by mouth once a day    Lasix 80 Mg Tabs (Furosemide) ..... One and a half by mouth two times a day    Toprol Xl 25 Mg Xr24h-tab (Metoprolol succinate) .Marland Kitchen... Take 1 tablet by mouth once a day    Warfarin Sodium 5 Mg Tabs (Warfarin sodium) .Marland Kitchen... Take 1-2 tablet by mouth once a day    Avapro 150 Mg Tabs (Irbesartan) .Marland Kitchen... Take 1 tablet by mouth once a day    Aspirin 81 Mg Tbec (Aspirin) .Marland Kitchen... Take 1 tablet by mouth once a day    Digoxin 0.125 Mg Tabs (Digoxin) .Marland Kitchen... Take 1 tablet by mouth once a day  Problem # 2:  ICD - IN SITU (ICD-V45.02) revision of ICD is without complications.  Pocket site appears to be well healed  Problem # 3:  CARDIOMYOPATHY, ISCHEMIC (ICD-414.8) Assessment: Comment Only  His updated medication list for this problem includes:    Amiodarone Hcl 200 Mg Tabs (Amiodarone hcl) .Marland Kitchen... Take 1/2 tablet (100mg ) daily    Isosorbide Mononitrate Cr 60 Mg Xr24h-tab (Isosorbide mononitrate) .Marland Kitchen... Take 1 tablet by mouth once a day    Lasix 80 Mg Tabs (Furosemide) ..... One and a half by mouth two times a day    Toprol Xl 25 Mg Xr24h-tab (Metoprolol succinate) .Marland Kitchen... Take 1 tablet by mouth once a day    Warfarin Sodium 5 Mg Tabs (Warfarin sodium) .Marland Kitchen... Take 1-2 tablet by mouth once a day    Avapro 150 Mg Tabs (Irbesartan) .Marland Kitchen... Take 1 tablet by mouth once a day    Aspirin 81 Mg Tbec (Aspirin) .Marland Kitchen... Take 1 tablet by mouth once a day    Digoxin 0.125 Mg Tabs (Digoxin) .Marland Kitchen... Take 1 tablet by mouth once a day  Problem # 4:  ATRIAL FIBRILLATION (ICD-427.31) continue Coumadin. EKG was reviewed and the patient remained in normal sinus rhythm with biventricular pacing. The patient reports a weakness and staggering gait cut his amiodarone 100 mg p.o. q. daily His updated medication list for this problem includes:    Amiodarone Hcl 200 Mg Tabs (Amiodarone hcl) .Marland Kitchen... Take 1/2 tablet (100mg ) daily    Toprol Xl 25 Mg Xr24h-tab (Metoprolol succinate) .Marland Kitchen... Take 1 tablet by mouth once a day    Warfarin Sodium 5 Mg Tabs (Warfarin sodium) .Marland Kitchen... Take 1-2 tablet by mouth once a day    Aspirin 81 Mg Tbec (Aspirin) .Marland Kitchen... Take 1 tablet by mouth once a day    Digoxin 0.125 Mg Tabs (Digoxin) .Marland Kitchen... Take 1 tablet by mouth once a day  Orders: EKG w/ Interpretation (93000)  Patient Instructions: 1)  Decrease Amiodarone to 100mg  daily 2)  Labs - 3 months 3)  Follow up in  6 months

## 2010-02-19 NOTE — Medication Information (Signed)
Summary: ccr-lr  Anticoagulant Therapy  Managed by: Vashti Hey, RN Referring MD: Andee Lineman PCP: Dr. Ardeen Garland Supervising MD: Diona Browner MD, Remi Deter Indication 1: Atrial Fibrillation (ICD-427.31) [facility] [place] Site: [facility] INR POC 1.7  Dietary changes: no    Health status changes: no    Bleeding/hemorrhagic complications: no    Recent/future hospitalizations: no    Any changes in medication regimen? no    Recent/future dental: no  Any missed doses?: no       Is patient compliant with meds? yes       Allergies: 1)  ! Pcn 2)  ! Celebrex 3)  ! Clarene Essex  Anticoagulation Management History:      The patient is taking warfarin and comes in today for a routine follow up visit.  Positive risk factors for bleeding include an age of 21 years or older and presence of serious comorbidities.  The bleeding index is 'intermediate risk'.  Positive CHADS2 values include History of CHF, History of HTN, Age > 20 years old, and History of Diabetes.  The start date was 10/18/2002.  His last INR was 2.2.  Anticoagulation responsible provider: Diona Browner MD, Remi Deter.  INR POC: 1.7.  Cuvette Lot#: [id number].  Exp: 10/11.    Anticoagulation Management Assessment/Plan:      The patient's current anticoagulation dose is Warfarin sodium 5 mg tabs: Take 1-2 tablet by mouth once a day.  The target INR is 2 - 3.  The next INR is due 09/09/2009.  Anticoagulation instructions were given to patient.  Results were reviewed/authorized by Vashti Hey, RN.  He was notified by Vashti Hey RN.         Prior Anticoagulation Instructions: INR 2.1 Continue coumadin 2.5mg  once daily except 5mg  on S,T,Th  Current Anticoagulation Instructions: INR 1.7 Take coumadin 1 1/2 tablets tonight, 1 tablet tomorrow night then resume 1/2 tablet once daily except 1 tablet on S,T,Th

## 2010-03-09 ENCOUNTER — Encounter: Payer: Self-pay | Admitting: Internal Medicine

## 2010-03-17 ENCOUNTER — Encounter: Payer: Self-pay | Admitting: Cardiology

## 2010-03-17 ENCOUNTER — Encounter (INDEPENDENT_AMBULATORY_CARE_PROVIDER_SITE_OTHER): Payer: Medicare Other

## 2010-03-17 DIAGNOSIS — I4891 Unspecified atrial fibrillation: Secondary | ICD-10-CM

## 2010-03-17 DIAGNOSIS — Z7901 Long term (current) use of anticoagulants: Secondary | ICD-10-CM

## 2010-03-17 LAB — CONVERTED CEMR LAB: INR: 2.6

## 2010-03-18 ENCOUNTER — Encounter: Payer: Self-pay | Admitting: Cardiology

## 2010-03-24 ENCOUNTER — Encounter: Payer: Self-pay | Admitting: Cardiology

## 2010-03-24 DIAGNOSIS — D751 Secondary polycythemia: Secondary | ICD-10-CM | POA: Insufficient documentation

## 2010-03-25 ENCOUNTER — Encounter: Payer: Self-pay | Admitting: Cardiology

## 2010-03-26 NOTE — Medication Information (Signed)
Summary: ccr  Anticoagulant Therapy  Managed by: Vashti Hey, RN Referring MD: Andee Lineman PCP: Dr. Ardeen Garland Supervising MD: Dietrich Pates MD, Molly Maduro Indication 1: Atrial Fibrillation (ICD-427.31) Lab Used[facility] [place] Site: [facility] PT 28.1  Dietary changes: no    Health status changes: no    Bleeding/hemorrhagic complications: no    Recent/future hospitalizations: no    Any changes in medication regimen? no    Recent/future dental: no  Any missed doses?: no       Is patient compliant with meds? yes       Allergies: 1)  ! Pcn 2)  ! Celebrex 3)  ! * Ambien  Anticoagulation Management History:      The patient is taking warfarin and comes in today for a routine follow up visit.  Positive risk factors for bleeding include an age of 51 years or older and presence of serious comorbidities.  The bleeding index is 'intermediate risk'.  Positive CHADS2 values include History of CHF, History of HTN, Age > 42 years old, and History of Diabetes.  The start date was 10/18/2002.  His last INR was 2.2 and today's INR is 2.6.  Prothrombin time is 28.1.  Anticoagulation responsible provider: Dietrich Pates MD, Molly Maduro.  Cuvette Lot#: [id number].  Exp: 10/11.    Anticoagulation Management Assessment/Plan:      The patient's current anticoagulation dose is Warfarin sodium 5 mg tabs: Take 1-2 tablet by mouth once a day.  The target INR is 2 - 3.  The next INR is due 04/14/2010.  Anticoagulation instructions were given to patient.  Results were reviewed/authorized by Vashti Hey, RN.  He was notified by Vashti Hey RN.         Prior Anticoagulation Instructions: INR 2.4 Continue coumadin 5mg  once daily except 2.5mg  on M,W,F  Current Anticoagulation Instructions: INR 2.6 Continue coumadin 5mg  once daily except 2.5mg  on M,W,F

## 2010-03-30 ENCOUNTER — Encounter: Payer: Self-pay | Admitting: Cardiology

## 2010-03-31 NOTE — Miscellaneous (Signed)
Summary: Orders Update - Hematology   Clinical Lists Changes  Orders: Added new Referral order of Misc. Referral (Misc. Ref) - Signed

## 2010-03-31 NOTE — Miscellaneous (Signed)
Summary: Orders Update - cbc w/diff, ABG  Clinical Lists Changes  Problems: Added new problem of ERYTHROCYTOSIS (ICD-289.0) Orders: Added new Test order of T-CBC w/Diff [id number]) - Signed Added new Test order of T- * Misc. Laboratory test [id number]) - Signed

## 2010-04-03 ENCOUNTER — Ambulatory Visit: Payer: Self-pay | Admitting: Cardiology

## 2010-04-07 NOTE — Letter (Signed)
Summary: External Correspondence/  [facility]  External Correspondence/  [facility]   Imported By: Dorise Hiss 03/31/2010 10:54:04  _____________________________________________________________________  External Attachment:    Type:   Image     Comment:   External Document

## 2010-04-11 ENCOUNTER — Encounter: Payer: Self-pay | Admitting: Cardiology

## 2010-04-11 DIAGNOSIS — I4891 Unspecified atrial fibrillation: Secondary | ICD-10-CM

## 2010-04-11 DIAGNOSIS — Z7901 Long term (current) use of anticoagulants: Secondary | ICD-10-CM

## 2010-04-12 LAB — URINALYSIS, MICROSCOPIC ONLY
Hgb urine dipstick: NEGATIVE
Ketones, ur: NEGATIVE mg/dL
Nitrite: NEGATIVE
Protein, ur: NEGATIVE mg/dL
Specific Gravity, Urine: 1.015 (ref 1.005–1.030)
Urobilinogen, UA: 1 mg/dL (ref 0.0–1.0)

## 2010-04-12 LAB — GLUCOSE, CAPILLARY
Glucose-Capillary: 139 mg/dL — ABNORMAL HIGH (ref 70–99)
Glucose-Capillary: 168 mg/dL — ABNORMAL HIGH (ref 70–99)
Glucose-Capillary: 230 mg/dL — ABNORMAL HIGH (ref 70–99)
Glucose-Capillary: 258 mg/dL — ABNORMAL HIGH (ref 70–99)
Glucose-Capillary: 262 mg/dL — ABNORMAL HIGH (ref 70–99)
Glucose-Capillary: 264 mg/dL — ABNORMAL HIGH (ref 70–99)
Glucose-Capillary: 273 mg/dL — ABNORMAL HIGH (ref 70–99)
Glucose-Capillary: 430 mg/dL — ABNORMAL HIGH (ref 70–99)

## 2010-04-12 LAB — COMPREHENSIVE METABOLIC PANEL
BUN: 22 mg/dL (ref 6–23)
CO2: 34 mEq/L — ABNORMAL HIGH (ref 19–32)
Calcium: 8.7 mg/dL (ref 8.4–10.5)
Creatinine, Ser: 1.42 mg/dL (ref 0.4–1.5)
GFR calc Af Amer: 58 mL/min — ABNORMAL LOW (ref >60)
GFR calc non Af Amer: 48 mL/min — ABNORMAL LOW (ref >60)
Glucose, Bld: 359 mg/dL — ABNORMAL HIGH (ref 70–99)

## 2010-04-12 LAB — BASIC METABOLIC PANEL
BUN: 21 mg/dL (ref 6–23)
CO2: 36 mEq/L — ABNORMAL HIGH (ref 19–32)
Chloride: 93 mEq/L — ABNORMAL LOW (ref 96–112)
Creatinine, Ser: 1.4 mg/dL (ref 0.4–1.5)

## 2010-04-12 LAB — PROTIME-INR
INR: 2.75 — ABNORMAL HIGH (ref 0.00–1.49)
Prothrombin Time: 28.9 seconds — ABNORMAL HIGH (ref 11.6–15.2)
Prothrombin Time: 29.8 seconds — ABNORMAL HIGH (ref 11.6–15.2)

## 2010-04-12 LAB — CBC
HCT: 55.3 % — ABNORMAL HIGH (ref 39.0–52.0)
Hemoglobin: 18.8 g/dL — ABNORMAL HIGH (ref 13.0–17.0)
MCHC: 32.5 g/dL (ref 30.0–36.0)
MCHC: 33.9 g/dL (ref 30.0–36.0)
MCV: 100.1 fL — ABNORMAL HIGH (ref 78.0–100.0)
MCV: 100.6 fL — ABNORMAL HIGH (ref 78.0–100.0)
Platelets: 143 10*3/uL — ABNORMAL LOW (ref 150–400)
RBC: 5.52 MIL/uL (ref 4.22–5.81)
RBC: 5.75 MIL/uL (ref 4.22–5.81)

## 2010-04-12 LAB — HEMOGLOBIN A1C: Hgb A1c MFr Bld: 9.4 % — ABNORMAL HIGH (ref 4.6–6.1)

## 2010-04-12 LAB — TYPE AND SCREEN

## 2010-04-14 ENCOUNTER — Ambulatory Visit (INDEPENDENT_AMBULATORY_CARE_PROVIDER_SITE_OTHER): Payer: Medicare Other | Admitting: *Deleted

## 2010-04-14 DIAGNOSIS — Z7901 Long term (current) use of anticoagulants: Secondary | ICD-10-CM

## 2010-04-14 DIAGNOSIS — I4891 Unspecified atrial fibrillation: Secondary | ICD-10-CM

## 2010-04-14 LAB — POCT INR: INR: 2.3

## 2010-05-01 ENCOUNTER — Ambulatory Visit (INDEPENDENT_AMBULATORY_CARE_PROVIDER_SITE_OTHER): Payer: Medicare Other | Admitting: Cardiology

## 2010-05-01 ENCOUNTER — Encounter: Payer: Self-pay | Admitting: Cardiology

## 2010-05-01 VITALS — BP 129/73 | HR 71 | Ht 72.0 in | Wt 233.0 lb

## 2010-05-01 DIAGNOSIS — D751 Secondary polycythemia: Secondary | ICD-10-CM

## 2010-05-01 DIAGNOSIS — I10 Essential (primary) hypertension: Secondary | ICD-10-CM

## 2010-05-01 DIAGNOSIS — I4891 Unspecified atrial fibrillation: Secondary | ICD-10-CM

## 2010-05-01 DIAGNOSIS — I2510 Atherosclerotic heart disease of native coronary artery without angina pectoris: Secondary | ICD-10-CM

## 2010-05-01 DIAGNOSIS — Z95810 Presence of automatic (implantable) cardiac defibrillator: Secondary | ICD-10-CM

## 2010-05-01 MED ORDER — METOPROLOL SUCCINATE ER 25 MG PO TB24: 25.0000 mg | ORAL_TABLET | Freq: Two times a day (BID) | ORAL | Status: DC

## 2010-05-01 NOTE — Progress Notes (Signed)
HPI The patient is a 77 year old male with a history of ischemic heart disease, occluded RCA with left to right collaterals. He has a history of hypertension, hyperlipidemia, COPD and poorly controlled diabetes mellitus. Status post CRT-D and is followed in the EP clinic. The patient has a history of atrial fibrillation but has remained in normal sinus rhythm on amiodarone therapy. There have been no recent reports of discharge his. His last office visit TSH was scheduled and was found to be within normal limits. The patient's chronic hypoxemia and saturation level was 89% today. He uses oxygen. He was referred to hematology for erythrocytosis which is likely secondary to his hypoxemia. An erythropoietin level was within normal limits. The patient states that he is doing well. He denies any chest pain shortness of breath orthopnea PND. He canceled his last EP appointment and his defibrillator has not been interrogated recently. Is concerned about it because he has heard beeping noise is coming from the device, but has not received a countershock. The patient states that in the morning sometimes he feels that his heart has some extra beats, with a sensation of a "strong" heartbeat.  Allergies  Allergen Reactions  . Celecoxib   . Penicillins   . Zolpidem Tartrate     Current Outpatient Prescriptions on File Prior to Visit  Medication Sig Dispense Refill  . acetaminophen (TYLENOL) 500 MG tablet Take 500 mg by mouth every 12 (twelve) hours.       Marland Kitchen amiodarone (PACERONE) 200 MG tablet Take 100 mg by mouth daily.       Marland Kitchen aspirin 81 MG tablet Take 81 mg by mouth daily.        Marland Kitchen atorvastatin (LIPITOR) 10 MG tablet Take 10 mg by mouth daily.        . B Complex-C-Folic Acid (RENAL MULTIVITAMIN FORMULA PO) Take 1 capsule by mouth daily.        . diazepam (VALIUM) 5 MG tablet Take 5 mg by mouth every 8 (eight) hours as needed.       . digoxin (LANOXIN) 0.125 MG tablet Take 125 mcg by mouth every other  day.       . DULoxetine (CYMBALTA) 60 MG capsule Take 60 mg by mouth daily.        . ergocalciferol (VITAMIN D2) 50000 UNITS capsule Take 50,000 Units by mouth once a week.        . fluticasone (FLONASE) 50 MCG/ACT nasal spray 2 sprays by Nasal route daily.        . furosemide (LASIX) 80 MG tablet Take 120 mg by mouth 2 (two) times daily.       Marland Kitchen gabapentin (NEURONTIN) 100 MG capsule Take 100 mg by mouth daily.        . insulin NPH (HUMULIN N) 100 UNIT/ML injection Inject into the skin 2 (two) times daily.       . insulin regular (HUMULIN R,NOVOLIN R) 100 UNIT/ML injection Inject into the skin daily. 7-10 units bid        . irbesartan (AVAPRO) 150 MG tablet Take 150 mg by mouth at bedtime.        . isosorbide mononitrate (IMDUR) 60 MG 24 hr tablet Take 60 mg by mouth daily.        Marland Kitchen levothyroxine (SYNTHROID, LEVOTHROID) 100 MCG tablet Take 100 mcg by mouth daily.        Marland Kitchen loratadine (CLARITIN) 10 MG tablet Take 10 mg by mouth daily.        Marland Kitchen  morphine (MS CONTIN) 30 MG 12 hr tablet Take 30 mg by mouth 3 (three) times daily.        Marland Kitchen omeprazole (PRILOSEC) 20 MG capsule Take 20 mg by mouth daily.        Marland Kitchen warfarin (COUMADIN) 5 MG tablet Take 5 mg by mouth daily.        Marland Kitchen DISCONTD: metoprolol succinate (TOPROL-XL) 25 MG 24 hr tablet Take 25 mg by mouth daily.          Past Medical History  Diagnosis Date  . Systolic heart failure     chronic (EF was 20%,50% echo 2007)  . ICD (implantable cardiac defibrillator) battery depletion     in situ  . Cardiomyopathy     ischemic  . Atrial fibrillation   . Mitral regurgitation   . CAD (coronary artery disease)     unspecifide coronary artery disease status post multiple percutaneous coronary intervention in the past last cath in 07/2004: totally occluded RCA with left to right collaterals ,and a 70-75% mid to distal obtuse marginal stenosis medical therapy  . Hypertension   . Hyperlipidemia   . COPD (chronic obstructive pulmonary disease)   . GERD  (gastroesophageal reflux disease)   . Diabetes mellitus   . Sleep apnea   . Anemia     Past Surgical History  Procedure Date  . Insert / replace / remove pacemaker     medtronic insync sentry 7299-09/04/2004  . Cholecystectomy   . Cystoscopy     for bladder cancer years sgo    No family history on file.  History   Social History  . Marital Status: Married    Spouse Name: N/A    Number of Children: N/A  . Years of Education: N/A   Occupational History  . retired    Social History Main Topics  . Smoking status: Former Smoker    Quit date: 01/18/1994  . Smokeless tobacco: Not on file  . Alcohol Use: No  . Drug Use: No  . Sexually Active: Not on file   Other Topics Concern  . Not on file   Social History Narrative  . No narrative on file   Review of systems:Pertinent positives as outlined above. The remainder of the 18  point review of systems is negative   PHYSICAL EXAM BP 129/73  Pulse 71  Ht 6' (1.829 m)  Wt 233 lb (105.688 kg)  BMI 31.60 kg/m2  SpO2 89%  General: Well-developed, well-nourished in no distress Head: Normocephalic and atraumatic Eyes:PERRLA/EOMI intact, conjunctiva and lids normal Ears: No deformity or lesions Mouth:normal dentition, normal posterior pharynx Neck: Supple, no JVD.  No masses, thyromegaly or abnormal cervical nodes Lungs: Normal breath sounds bilaterally without wheezing.  Normal percussion Cardiac: regular rate and rhythm with normal S1 and S2, no S3 or S4.  PMI is normal.  No pathological murmurs Abdomen: Normal bowel sounds, abdomen is soft and nontender without masses, organomegaly or hernias noted.  No hepatosplenomegaly MSK: Back normal, normal gait muscle strength and tone normal Vascular: Pulse is normal in all 4 extremities Extremities: No peripheral pitting edema Neurologic: Alert and oriented x 3 Skin: Intact without lesions or rashes Lymphatics: No significant adenopathy Psychologic: Normal affect  ECG:  Normal sinus rhythm with ventricular pacing  ASSESSMENT AND PLAN

## 2010-05-01 NOTE — Assessment & Plan Note (Signed)
Schedule patient for ICD interrogation next week. He states he hears beeping sounds.

## 2010-05-01 NOTE — Assessment & Plan Note (Signed)
Normal erythropoietin levels evaluated by hematology likely secondary erythrocytosis secondary to hypoxemia

## 2010-05-01 NOTE — Patient Instructions (Signed)
   ICD interrogation next week  Increase Toprol XL to 25mg  twice a day   Your physician wants you to follow up in: 6 months.  You will receive a reminder letter in the mail one-two months in advance.  If you don't receive a letter, please call our office to schedule the follow up appointment

## 2010-05-01 NOTE — Assessment & Plan Note (Signed)
Increase metoprolol to 25 mg by mouth twice a day

## 2010-05-06 ENCOUNTER — Other Ambulatory Visit: Payer: Self-pay

## 2010-05-06 ENCOUNTER — Ambulatory Visit (INDEPENDENT_AMBULATORY_CARE_PROVIDER_SITE_OTHER): Payer: Medicare Other | Admitting: *Deleted

## 2010-05-06 DIAGNOSIS — I4891 Unspecified atrial fibrillation: Secondary | ICD-10-CM

## 2010-05-06 DIAGNOSIS — I5022 Chronic systolic (congestive) heart failure: Secondary | ICD-10-CM

## 2010-05-06 DIAGNOSIS — I428 Other cardiomyopathies: Secondary | ICD-10-CM

## 2010-05-06 NOTE — Progress Notes (Signed)
icd check in clinic  

## 2010-05-11 ENCOUNTER — Ambulatory Visit: Payer: Self-pay | Admitting: Cardiology

## 2010-05-12 ENCOUNTER — Ambulatory Visit (INDEPENDENT_AMBULATORY_CARE_PROVIDER_SITE_OTHER): Payer: Medicare Other | Admitting: *Deleted

## 2010-05-12 DIAGNOSIS — Z7901 Long term (current) use of anticoagulants: Secondary | ICD-10-CM

## 2010-05-12 DIAGNOSIS — I4891 Unspecified atrial fibrillation: Secondary | ICD-10-CM

## 2010-05-12 LAB — POCT INR: INR: 2.9

## 2010-06-02 NOTE — Letter (Signed)
June 21, 2006    [name] [name], M.D.  [address]  [place]. [place] [address]   RE:  [name] [name]  MRN:  [id number]  /  DOB:  07/26/1927   Dear [name] [name]:   I hope this letter finds you well.  [name] [name] came in today in followup  for congestive heart failure.  He is doing modestly well.  He has some  problems with continuing shortness of breath and some problems with  fluid overload. His wife says he is drinking too much.  His salt intake  is pretty well-controlled.   MEDICATIONS:  1. Notable for two things:  Apparently he is taking Lopressor 50 mg a      day.  2. His drug sheet from home has Toprol.  They are not quite sure which      one.  3. He also takes amiodarone, and they say that you are following his      amiodarone surveillance.  4. He is also taking Avandia for his diabetes.   PHYSICAL EXAMINATION:  VITAL SIGNS:  Blood pressure pretty good at  144/66, pulse 62.  LUNGS:  Clear.  HEART:  Sounds regular with an early systolic and quiet murmur.  EXTREMITIES:  Had 2+ peripheral edema.   Interrogation of his Medtronic [phone] ICD demonstrates a P-wave of 1.2,  impedance of 416, threshold 1 volt at 0.3.  The R-wave is 19.6, with an  impedance of 736 with a threshold of 1 volt at 0.2.  The LV impedance is  466, with a threshold of 1.5 at 0.4.  There are no inter-current  therapies.  The LV output was decreased.  The NID was increased because  of the 6949 lead.   IMPRESSION:  1. Ischemic cardiomyopathy with depressed left ventricular function.  2. Congestive heart failure - acute on chronic systolic fluid      overload.  3. Status post Contak [employer] implantable cardioverter      defibrillator for the above, with a 6949 lead, with reprogramming.  4. Diabetes.   Len, the big issues are medications and surveillance that I can see for  [name] [name].  The first is amiodarone surveillance, and if you will just  drop me a line letting me know that you are following his  TSH and his  liver function tests quarterly, that would be a big help.   In addition, I am pleased to tell you that he knew his hemoglobin A1c.  This was the first person in months who could tell me what his  hemoglobin A1c was, and that is a credit to you.   The third issue is the Avandia that he is taking.  With his fluid  overload and his shortness of breath, if there is any other alternatives  at all for his diabetic management, that would be preferable.   I will see him again in one year's time and he will be following up with  [name] [name] in the interim.    Sincerely,      Duke Salvia, MD, [facility]  Electronically Signed    SCK/MedQ  DD: 06/21/2006  DT: 06/21/2006  Job #: [id number]   CC:    [facility] [place], [place]

## 2010-06-02 NOTE — Assessment & Plan Note (Signed)
[facility]                          [facility] NOTE   [name], [name]                     MRN:          [id number]  DATE:07/08/2006                            DOB:          December 07, 1927    HISTORY OF PRESENT ILLNESS:  The patient is an elderly, white male with  a history of ischemic cardiomyopathy albeit with improved ejection  fraction most recently estimated at 50% in September 2007. The patient  has chronic dyspnea and NYHA class 2B/A. His dyspnea is multifactorial  and also has underlying lung disease for which he takes chronic oxygen  therapy. Make note that this patient was seen 3 days ago in our  cardiology clinic and actually was doing quite well and he was seen by  Tereso Newcomer. Recommendations were given to draw labs for amiodarone  surveillance but the patient refused and stated that they wanted to have  this done by Dr. Lysbeth Galas. In the interim they have seen Dr. Lysbeth Galas. We do  not have the results of this laboratory work. The patient also reports  symptoms of chronic poor appetite although he has had no frank weight  loss. He states that these problems have started since his gallbladder  operation. From a heart failure perspective, however, it appears to be  stable. Saturation 87% on room air, but did not bring his oxygen. He has  no chest pain, no shortness of breath at baseline.   MEDICATIONS:  1. __________ mg p.o. daily.  2. Toprol 25 mg p.o. daily.  3. Aspirin 81 mg a day.  4. Glucophage.  5. Oxycodone.  6. Coumadin as directed.  7. Lasix 120 mg p.o. twice daily.  8. Imdur 60 mg p.o. daily.  9. Amiodarone 200 mg p.o. daily.  10.Neurontin.  11.Lipitor.  12.Lanoxin 0.125 every other day.  13.Prilosec 40 daily.  14.Benadryl.  15.Acetaminophen p.r.n.  16.Regular insulin.  17.NPH insulin.   PHYSICAL EXAMINATION:  VITAL SIGNS:  Blood pressure 129/46, heart rate  67, saturations 87% on room air.  GENERAL:  A  well-nourished, white male somewhat pale appearing but in no  distress.  HEENT:  Normal.  NECK:  Normal. JVD is approximately 8 cm. Carotid upstroke normal.  LUNGS:  Diminished breath sounds bilaterally with no wheezing.  HEART:  Regular rate and rhythm. Normal S1 with __________ split S2. No  murmur, rubs or gallop.  ABDOMEN:  Soft.  EXTREMITIES:  1+ peripheral pitting edema.   PROBLEM LIST:  1. Coronary artery disease status post multiple interventions.  2. Ischemic cardiomyopathy with now an ejection fraction of 50%/NYHA      class 2B/A.  3. Left bundle branch block.  4. Chronic renal insufficiency.  5. History of paroxysmal atrial flutter and fibrillation.  6. Amiodarone therapy.  7. Chronic Coumadin therapy.  8. Chronic obstructive pulmonary disease on home O2.  9. Obstructive sleep apnea with CPAP.   PLAN:  1. The patient was just seen in our general cardiology clinic. He had      been doing quite well. He was hemodynamically stable and also      stable  from a heart failure perspective.  2. Tereso Newcomer saw the patient and his main concern was that      amiodarone __________ need monitoring. All of these studies have      been ordered and been reviewed by Dr. Lysbeth Galas.  3. From a heart failure perspective, the patient is stable and we made      no changes in his medical regimen. He can followup in 6 months in      our general cardiology clinic.     Learta Codding, MD,FACC  Electronically Signed    GED/MedQ  DD: 07/08/2006  DT: 07/08/2006  Job #: [id number]   cc:   Delaney Meigs, M.D.

## 2010-06-02 NOTE — Assessment & Plan Note (Signed)
[facility]                          [facility] NOTE   [name], [name]                     MRN:          [id number]  DATE:06/30/2006                            DOB:          09/14/1927    PRIMARY CARE PHYSICIAN:  Delaney Meigs, MD   PRIMARY CARDIOLOGIST:  Learta Codding, MD   ELECTROPHYSIOLOGIST:  Duke Salvia, MD   HISTORY OF PRESENT ILLNESS:  [name] is a 77 year old male patient  with a history of ischemic cardiomyopathy with an EF of 25% in the past  which had improved to 50% by echocardiogram done in September 2007,  paroxysmal atrial fibrillation/flutter on amiodarone and Coumadin  therapy who is status post cardiac resynchronization therapy and AICD  implantation who presents to the office today for followup.  He actually  just saw Dr. Graciela Husbands in [place] last week.  At that time Dr. Graciela Husbands was  interested in seeing if the patient could come off of his Avandia and  left this up to Dr. Lysbeth Galas.  He also wanted to make sure that the  patient was having surveillance labs for his amiodarone.  The patient  had noted to Dr. Graciela Husbands that he was getting this done with Dr. Lysbeth Galas.  In the office today he notes that he is doing about the same.  He is on  chronic O2.  He has severe COPD.  He is followed by Dr. Orson Aloe and  sees him sometime soon.  His wife notes that he is not very active at  all.  He gets short of breath with minimal activity.  He really  describes New York Heart Association class III to class III-B symptoms.  He sleeps on two pillows.  There has been no change there.  He denies  any paroxysmal nocturnal dyspnea.  His lower extremity edema is  unchanged per his report.  He notes that his weight fluctuates up and  down.  He says that he is not eating much salt.  He does drink quite a  bit of fluid.   CURRENT MEDICATIONS:  1. Toprol XL 25 mg daily.  2. Prilosec daily.  3. NovoLog insulin.  4. Regular  insulin.  5. Avandia 8 mg daily.  6. Aspirin 81 mg daily.  7. Glucophage 1 gram b.i.d.  8. Avapro 300 mg daily.  9. Oxycodone 40 mg b.i.d.  10.Coumadin as directed.  11.Lasix 80 mg one and one half tablet b.i.d.  12.Imdur 60 mg daily.  13.Amiodarone 200 mg daily.  14.Neurontin 100 mg daily.  15.Lipitor 5 mg daily.  16.Valium 5 mg daily.  17.Lanoxin 0.25 mg.  18.Tylenol p.r.n.   ALLERGIES:  PENICILLIN AND CELEBREX.   PHYSICAL EXAMINATION:  GENERAL:  He is a well-nourished, well-developed  male in no acute distress.  VITAL SIGNS:  Blood pressure 116/59, pulse 62, weight 245 pounds.  When  last seen in the office in January 2007, he was 235 pounds.  Oxygen  saturation was 86-89% while he was off of his oxygen.  HEENT:  Normal.  NECK:  I cannot appreciate any JVD.  CARDIAC:  Normal S1/S2.  Regular rate and rhythm.  LUNGS:  With decreased breath sounds bilaterally.  Faint crackles at the  bases otherwise clear.  ABDOMEN:  Normoactive bowel sounds.  No organomegaly.  EXTREMITIES:  1+ edema bilaterally.  Calves soft, nontender.  SKIN:  Warm and dry.  NEUROLOGIC:  He is alert and oriented x3.  Cranial nerves II-XII grossly  intact.   ELECTROCARDIOGRAM:  Reveals underlying normal sinus rhythm, ventricular  pacing rate of 63.   IMPRESSION:  1. Ischemic cardiomyopathy with previous ejection fraction of 25%.      a.     Ejection fraction improved to 50% by recent echocardiogram       in September 2007.  2. Coronary artery disease.      a.     Status post non-ST elevation myocardial infarction December       2005.      b.     Cardiac catheterization July 2006:  Subsequently occluded       right coronary artery at previous stent site with left-to-right       collaterals; left main normal; left anterior descending proximal       30-40%, mid 30% in-stent restenosis followed by 30% tubular and       multiple luminal irregularities; first diagonal 40% ostial, second       diagonal 30%  proximal; left circumflex 30% proximal, 30% distal;       large obtuse marginal #2 40% proximal, 70-75% mid to distal       unchanged from 2004 - medical therapy.      c.     Status post multiple percutaneous coronary interventions in       the past.  3. Status post CRT/automatic implantable cardioverter/defibrillator      implantation with Medtronic InSync Sentry device August 2006.  4. Mild mitral regurgitation.  5. Paroxysmal atrial fibrillation/flutter.      a.     Coumadin therapy.      b.     Amiodarone therapy.  6. Left bundle branch block.  7. Severe chronic obstructive pulmonary disease on home oxygen.  8. Obstructive sleep apnea noncompliant with continuous positive      airway pressure secondary to claustrophobia.  9. Hypertension.  10.Diabetes mellitus.  11.Dyslipidemia.  12.Chronic renal insufficiency.  13.Gastroesophageal reflux disease.  14.Status post cholecystectomy.   PLAN:  The patient returns to the office today for followup.  We have  actually not seen him in about a year.  He just did see Dr. Graciela Husbands.  The  patient has followup with Dr. Lysbeth Galas next week.  One of the major  questions is whether or not he is having amiodarone surveillance labs  with Dr. Lysbeth Galas.  At this point in time that would include followup  chest x-ray, thyroid function, liver enzymes, and lipids.  If that is  not being followed in his office then we will be happy to check those  here.  I did want to check a BMET and a BNP level on the patient today  to see if he was receiving adequate diuresis.  I think his dyspnea is  multifactorial secondary to his heart failure and his severe COPD and  deconditioning.  However, the patient does not want to have labs done  today as he does see Dr. Lysbeth Galas next week.  We will have the labs drawn  at Dr. Joyce Copa.  I have given him a prescription with the labs listed on it.  If his  BNP is significantly elevated then we will need to  rethink his diuresis.   All of his other meds will be left the same.  I  have recommended he get back to see Dr. Andee Lineman in the CHF clinic in 3  months.     Tereso Newcomer, PA-C  Electronically Signed    SW/MedQ  DD: 06/30/2006  DT: 06/30/2006  Job #: [id number]   cc:   Delaney Meigs, M.D.

## 2010-06-02 NOTE — H&P (Signed)
[facility] ADMISSION   [name], [name]                     MRN:          [id number]  DATE:03/25/2009                            DOB:          1927/11/13    TIME:  9:45 p.m.   PRIMARY CARDIOLOGIST:  Learta Codding, MD, [facility]   PRIMARY CARE PHYSICIAN:  Delaney Meigs, MD   ELECTROPHYSIOLOGY DOCTOR:  Dr. Graciela Husbands.   CHIEF COMPLAINT:  Lead fracture.   HISTORY OF PRESENT ILLNESS:  The patient is a 77 year old male with  known ischemic cardiomyopathy status post ICD placement with CRT,  paroxysmal atrial fibrillation, diabetes and COPD who presents to  transfer from [facility].  The patient has been in his usual  state of health and largely without complaints.  This was validated with  his family including his wife and children at the bed side.  He denies  any frank palpitations, ICD discharge, episodes of syncope, episodes of  chest pain or worsened lower extremity edema.  He does have baseline  chronic shortness of breath that has been unchanged.  He reports  compliance with medications.  He received a call from [facility] as a  routine telemetry download from his pacemaker which was concerning for  the ICD lead fracture.   PAST MEDICAL HISTORY:  1. Diabetes.  2. Coronary artery disease status post PCI.  3. Ischemic cardiomyopathy status post Medtronic ICD.  4. GERD.  5. COPD, on home oxygen.  6. Paroxysmal atrial fibrillation.  7. Chronic kidney disease.   MEDICATIONS:  1. Aspirin 81 mg p.o. daily.  2. MS Contin 60 mg b.i.d.  3. Digoxin 0.125 mg p.o. daily.  4. Omeprazole 20 mg p.o. daily.  5. Cymbalta 60 mg p.o. daily.  6. Renal multivitamin.  7. Gabapentin 100 mg daily.  8. Humulin 55 units in the morning and 50 units in the evening.  9. Amiodarone 200 mg daily.  10.Imdur 60 mg daily.  11.Lasix 80 mg b.i.d.  12.Lipitor 10 mg daily.  13.Toprol-XL 25 mg daily.  14.Warfarin 5 mg p.o.  daily.  15.Avapro 300 mg p.o. daily.   ALLERGIES:  PENICILLIN, NSAIDS which he avoids due to kidney disease and  iodine containing dyes and sulfa.   SOCIAL HISTORY:  He denies tobacco, alcohol and drug use now.  He is a  former Geographical information systems officer.  He smokes remotely.   FAMILY HISTORY:  He does have a brother who recently died.  Dad died of  lung cancer, but no family history of early coronary artery disease.   REVIEW OF SYSTEMS:  A full 14-point review of symptoms is negative  except as noted above in the HPI.   PHYSICAL EXAMINATION:  VITAL SIGNS:  Blood pressure 125/66, sats are  91%, pulse 75, temperature is 98.6.  GENERAL:  A well appearing elderly white male in no acute distress.  He  is mildly hard of hearing, but oriented to person, place and time.  HEENT:  Normocephalic, atraumatic.  Pupils are equal, round, and  reactive to light and accommodation.  Mild changes consistent  with  cataracts.  Sclerae nonicteric.  NECK:  Supple.  No evidence of JVD.  No frank bruits.  HEART:  Regular rate and rhythm.  Soft S1.  CHEST:  Decreased breath sounds.  No frank crackles.  No frank wheeze,  just decreased air movement throughout.  ABDOMEN:  Obese, soft, nontender, nondistended.  EXTREMITIES:  Without clubbing, cyanosis or edema.  He does have scale  to his skin and some changes consistent with atopic dermatitis.  He does  have subtle mild clubbing of his nails.  MUSCULOSKELETAL:  Normal tone and bulk throughout.   LABORATORY DATA:  On admission here reveals a CBC with a white count of  7.3, hemoglobin of 80.7, platelet count of 183.  CMP reveals sodium of  134, potassium 4.4, chloride of 91, bicarb of 34, glucose of 359, BUN is  22, creatinine is 1.42 which appears to be a stable baseline.  His liver  function test including AST, ALT, total protein and bilirubin were all  within normal limits.  His INR is elevated at 2.75.  Interrogation by  the Medtronic device rep is attending  at the present time.  He does have  a magnet placed over his pacemaker/ICD.   ASSESSMENT AND PLAN:  15. A 77 year old male with history of coronary artery disease status      post percutaneous coronary intervention, ischemic cardiomyopathy      status post implantable cardioverter-defibrillator (Medtronic).  2. Diabetes.  3. Chronic obstructive pulmonary disease, presents without any      symptoms but noted to have a lead fracture by telemetry.      a.     We will admit him to a telemetry bed under the care of Dr.       Johney Frame by [facility].      b.     Lead fracture known to be a [employer] lead with       concerns for lead fracture based on outside report.  We will       contact the Medtronic rep to come evaluate his pacemaker and turn       VT/VF therapies off.  In the interim, we will maintain a magnet       placed over this device.  He has had no evidence of syncope or       apparent discharges.      c.     We will make him n.p.o. after midnight for possible lead       revision.  If he does have mildly elevated INR, we will type and       screen him for possible FFP in the morning.  We will let him drift       down and hold his Coumadin at the present time.      d.     With regard to his atrial fibrillation, we will continue       rate control with this regimen including amiodarone, digoxin, beta-       blocker.  If this stretches out, he may eventually need some sort       of heparin bridge.  We will defer this at the present time as he       is presently therapeutic, but his day-to-day risk of stroke is       associated with atrial fibrillation as well.  4. Diabetes.  Continue his sliding scale insulin and NPH.  We will      give him  a normal dose now and then change him to half dose in the      morning while he is n.p.o.  5. Fluids, electrolytes, nutrition.  N.p.o. past mid night.      Electrolytes appear within normal limits.  6. Coronary artery disease.  We will  continue his baseline Imdur, arb,      statin, aspirin as at home.  CKD appears to be at his stable      baseline.  No indications for dialysis at the present time.  7. Hyperlipidemia.  Fasting lipid profile and statin as above.      Vinnie Level, MD    PMB/MedQ  DD: 03/26/2009  DT: 03/26/2009  Job #: [id number]

## 2010-06-02 NOTE — Assessment & Plan Note (Signed)
[facility]                          [facility] NOTE   [name], [name]                     MRN:          [id number]  DATE:07/03/2007                            DOB:          1927/05/21    CARDIOLOGIST:  Learta Codding, MD,FACC.   PRIMARY CARE PHYSICIAN:  Delaney Meigs, M.D.   REASON FOR VISIT:  One-year followup.   HISTORY OF PRESENT ILLNESS:  [name] is a 77 year old male [name]  with a history of ischemic cardiomyopathy, status post CRT/ICD  implantation, who returns for a routine followup.  He was last seen in  [name] office by Dr. Andee Lineman, July 08, 2006.  At that point in time, he was  stable.  Of note, [name] is on amiodarone for history of paroxysmal  atrial flutter/fibrillation.  He has noted in [name] past that Dr. Lysbeth Galas  follows his amiodarone labs.  He is due to see Dr. Lysbeth Galas tomorrow for  followup.  He is also due to see Dr.  Graciela Husbands sometime this week.   In [name] office today, [name] notes he is doing about [name] same.  He  continues to note dyspnea with exertion.  There has been no significant  change.  He describes NYHA class III symptoms.  He denies orthopnea or  PND.  He denies chest pain.  He denies syncope.  He denies any ICD  therapies.   Of note, [name] does note a history of falls.  He has fallen 4  times in [name] last year.  He denies any significant injuries.  He has  noted some problems with balance over time.  He has seen his pain doctor  that manages degenerative disk disease.  Apparently, his pain  medications were decreased in dosage when he was last seen.   CURRENT MEDICATIONS:  1. Toprol-XL 25 mg daily.  2. Prilosec 40 mg daily.  3. Insulin N 40 units in [name] morning, 30 units in [name] evening,  .      Insulin R 7 units in [name] morning, 10 units in [name] evening.  4. Glucophage 1 g b.i.d.  5. Oxycodone 30 mg b.i.d.  6. Digitek 0.125 mg every other day.  7. Arthritis Tylenol  b.i.d.  8.  Cymbalta 60 mg daily.  9. Aspirin 81 mg daily.  10.Avapro 300 mg half tablet daily.  11.Coumadin as directed.  12.Lasix 80 mg one and a half tablets b.i.d.  13.Imdur 60 mg daily.  14.Amiodarone 200 mg daily.  15.Neurontin 100 mg daily.  16.Lipitor 5 mg daily.  17.Valium 5 mg daily.  18.Allergy medicine daily.   PHYSICAL EXAMINATION:  GENERAL:  He is a well-nourished, well-developed  man.  VITAL SIGNS:  Blood pressure 111/57, pulse 63, and weight 221.6 pounds,  this is down from 245 pounds since his last visit a year ago.  HEENT:  Normal.  NECK:  Without JVD.  VASCULAR:  Carotids without bruits bilaterally.  CARDIAC:  Normal S1 and S2.  Regular rate and [name].  LUNGS:  Decreased breath sounds bilaterally.  Left basilar crackles.  No  wheezes.  ABDOMEN:  Soft, nontender.  EXTREMITIES:  Trace ankle edema bilaterally.  SKIN:  Warm and dry.  His complexion is somewhat pale.  NEUROLOGIC:  He is alert and oriented x3.  Cranial nerves II-XII are  grossly intact.   Electrocardiogram reveals underlying sinus [name], ventricular paced  [name].  Heart rate 64.   IMPRESSION:  1. Ischemic cardiomyopathy with previous ejection fraction of 25%,      improved to 50% by echocardiogram September 2007.  2. Coronary artery disease, status post multiple percutaneous coronary      interventions in [name] past.      a.     Last cardiac catheterization in July 2006:  Totally occluded       RCA with left-to-right collaterals, and a 70-75% mid-to-distal       obtuse marginal stenosis - medical therapy.  3. Status post CRT/ICD implantation in August 2006.  4. Mild mitral regurgitation.  5. Paroxysmal atrial fibrillation/flutter, on Coumadin therapy.      a.     Amiodarone therapy.  6. Severe chronic obstructive pulmonary disease, on home oxygen      therapy followed by Dr. Orson Aloe.  7. Sleep apnea.  8. Hypertension.  9. Diabetes mellitus.  10.Dyslipidemia.  11.Chronic kidney disease.   12.Gastroesophageal reflux disease.   PLAN:  1. [name] presents for a routine followup.  Overall, he is doing      well.  He appears somewhat pale.  His wife says that this is his      normal complexion.  He sees Dr. Lysbeth Galas tomorrow.  Given his history      of Coumadin therapy and aspirin therapy, I would recommend  a CBC      to make sure he is not significantly anemic.  Apparently, Dr.      Lysbeth Galas has recently done multiple labs.  We will try to obtain      those from Dr. Lysbeth Galas.  2. [name] continues on amiodarone.  He needs continued      surveillance labs.  He notes that Dr. Lysbeth Galas continues to check      these.  We will try to get results of this blood work as well.  3. We will also proceed with a chest x-ray given his continued      amiodarone therapy and history of chronic obstructive pulmonary      disease.  4. [name] has not had an ischemic evaluation since 2006.  He will      be set up for a dobutamine Cardiolite study to rule out ischemia.  5. [name] noted some problems with balance.  We will need to      continue to monitor his balance over time.  If it continues to      deteriorate we may want to re-think Coumadin therapy.  Of note, I      asked him if he would be interested in seeing physical therapy.  He      wants to hold off on this for now.  6. [name] will return in 6 months for a routine followup or      sooner p.r.n.      Tereso Newcomer, PA-C  Electronically Signed      Learta Codding, MD,FACC  Electronically Signed   SW/MedQ  DD: 07/03/2007  DT: 07/04/2007  Job #: [id number]   cc:   Delaney Meigs, M.D.

## 2010-06-02 NOTE — Letter (Signed)
July 06, 2007    [name], M.D.  [address]  [place], [place] [address]   RE:  [name], [name]  MRN:  [id number]  /  DOB:  1927-10-17   Dear Marolyn Haller:   I hope this letter finds you and your family well.   [name] comes in today without complaints of chest pain or shortness  of breath.  He has been tired.  He has been told that he looks pale and  apparently you are working on his anemia now.  He apparently is going to  be taking iron.   MEDICATIONS:  1. Toprol 25.  2. Insulin.  3. Glucophage 1000 b.i.d.  4. Digitek 0.125.  5. Arthritis.  6. Cymbalta.  7. Testosterone.   PHYSICAL EXAMINATION:  VITAL SIGNS:  His blood pressure is 119/59 with a  pulse 77.  HEENT:  His sclerae were very pale.  LUNGS:  Clear.  HEART:  Sounds were regular.  EXTREMITIES:  Without edema.   Interrogation of his Medtronic InSync [id number] ICD demonstrates a P-  wave of 1.4, impedance of 432, and threshold of 1 volt at 0.2.  R-wave  was 19.6 with an impedance of 720, threshold of 1 volt at 0.3 and the LV  impedance was 48 with a threshold 1.5 at 0.4.  The patient has a 69:49  lead and his optimal index is elevated.   IMPRESSION:  1. Congestive heart failure - chronic - systolic.  2. Ischemic cardiomyopathy.  3. Status post CRT - due for the above.  4. 69:49 lead V.  5. Anemia.   [name] is doing stable from an arrhythmia point of view.  I am  bothered by his optimal index, but I suspect that it may be in part  related to what appears to be a significant anemia based on his scleral  color.   I would have a low threshold to transfuse him to hemoglobin of 9 or 10  given the above.   Please let us know if there is anything we can do, we will plan to see  him again in 3 months' time.    Sincerely,      Duke Salvia, MD, [facility]  Electronically Signed    SCK/MedQ  DD: 07/06/2007  DT: 07/07/2007  Job #: [id number]

## 2010-06-02 NOTE — Cardiovascular Report (Signed)
[facility]                   [place] ELECTROPHYSIOLOGY DEVICE CLINIC NOTE   TRAYVION, EMBLETON                     MRN:          [id number]  DATE:06/21/2008                            DOB:          1927-12-30    Mr. Calabrese is seen in follow up for ischemic cardiomyopathy, prior  revascularization, chronic renal insufficiency, status post ICD  implantation.  He has a CRT device and this has been associated with  near normalization of his LV systolic function, most recently evaluated  in September 2007.   The major question that they had today is, why he was taking  antihypertensive therapy specifically the Avapro and the Toprol, and I  related that the value of that in the setting of his cardiomyopathy.  They were also asking about his amiodarone which he takes for atrial  fibrillation.  He has had no intercurrent problems with chest pain or  shortness of breath and there have been no palpitations.   PHYSICAL EXAMINATION:  VITAL SIGNS:  His weight today was 234, which is  up 15 pounds in the last year.  His blood pressure is 118/70, his pulse  was 68.  NECK:  His neck veins were 6-7 cm.  LUNGS:  Clear.  HEART:  Sounds were regular.  ABDOMEN:  Soft.  EXTREMITIES:  Had no edema.  SKIN:  Warm and dry.  GENERAL:  He is alert and well.   Interrogation of his Medtronic sensory ICD demonstrates a P-wave of 1.3  with impedance of 456 with threshold of 1 volt at 0.2.  The R-wave was  19.6 with impedance of 568, threshold of 1 volt at 0.3.  The LV  impedance was 504, threshold of 1 volt at 0.4.  Battery voltage was  2.97.  There are no intercurrent therapies or arrhythmias.   IMPRESSION:  1. Ischemic cardiomyopathy.  2. Congestive heart failure - chronic - systolic with now significant      intercurrent improvement in left ventricular ejection fraction (as      of 2007).  3. Atrial fibrillation - paroxysmal - recurrent.  He takes amiodarone      and  Coumadin for this.   Mr. Kissel is doing well from an arrhythmia point-of-view.  The atrial  fibrillation is a little bit bothersome, this appears to be new and more  frequent over the last number of months.  We will continue on his  current amiodarone dose for now.  I have asked that he get liver  function tests and thyroid tests every 3 months via Dr. Lysbeth Galas.  We will  not plan to draw any blood at this time.   We will see him again in 12 months' time.     Duke Salvia, MD, [facility]  Electronically Signed    SCK/MedQ  DD: 06/21/2008  DT: 06/22/2008  Job #: [id number]   cc:   Erasmo Downer, MD  Delaney Meigs, M.D.

## 2010-06-05 NOTE — H&P (Signed)
NAME:  [name], [name]                        ACCOUNT NO.:  [id number]   MEDICAL RECORD NO.:  [id number]                   PATIENT TYPE:  EMS   LOCATION:  ED                                   FACILITY:  [facility]   PHYSICIAN:  Sarita Bottom, M.D.                  DATE OF BIRTH:  [birth year]   DATE OF ADMISSION:  02/26/2002  DATE OF DISCHARGE:                                HISTORY & PHYSICAL   PRIMARY CARE PHYSICIAN:  Dr. Dewaine Conger of [place].   CHIEF COMPLAINT:  I have pain in my belly.   HISTORY OF PRESENT ILLNESS:  The patient is a 77 year old man with a history  of coronary artery disease.  He is status post angioplasty with stent  placement. He was apparently well until this morning when he woke up with  pain in his epigastrium and also in his right upper abdominal quadrant.  Describes the pain as burning in sensation 7/10.  There were no aggravating  or relieving factors.  Pain did not radiate to anywhere.  It was associated  with nausea and he had some slight palpitations.  He has a baseline  shortness of breath which was not aggravated with this pain.  He decided to  come to the emergency room on account of the above pain.   REVIEW OF SYSTEMS:  He denies any fever or cough.  He denies any vomiting,  denies any diarrhea, denies any lightheadedness.  All other systems reviewed  and were negative.   PAST MEDICAL HISTORY:  1. He has a history of coronary artery disease.  He is status post     angioplasty with stent placement.  He had cardiac  cath done two weeks     ago which shows subtotal occlusion of the right coronary artery with     collaterals of the left coronary artery.  He had ________ of the ______     of the left system.  He also says he has a history of left bundle branch     block and that his EF done on the cardiac cath was greater than 22%.  2. He has a history of diabetes mellitus.   CURRENT MEDICATIONS:  1. Digoxin 0.25 mg daily.  2. Monopril 20 mg daily.  3.  Aldactone 25 mg daily.  4. Coumadin 5 mg daily for atrial fibrillation.  5. Lasix 40 mg b.i.d.  6. Zaroxolyn 2.5 mg daily.  7. Lopressor 50 mg b.i.d.  8. Avandia 8 mg daily.  9. Neurontin 200 mg daily.   ALLERGIES:  PENICILLIN.   FAMILY HISTORY:  [place] Spaniel most of the relatives have gallstones.   SOCIAL HISTORY:  He is married.  He stopped smoking several years ago.  He  drinks alcohol occasionally.   PHYSICAL EXAMINATION:  VITAL SIGNS:  BP 131/54, heart rate of 74.  He is  afebrile.  GENERAL:  He is an elderly  man lying comfortably on the stretcher not in any  apparent distress.  HEENT:  He is not pale.  Pupils equal and reactive to light and  accommodation.  He is anicteric.  NECK:  Supple.  There is no jugular venous distention.  CHEST:  He has a few bibasilar rales.  CARDIOVASCULAR:  Heart sounds S1 & S2 were normal.  Rhythm appears regular.  He has a grade 2/6 systolic ejection murmur heard best at the left lower  sternal border.  ABDOMEN:  Abdomen is soft, bowel sounds are present.  There is no tenderness  on deep palpation.  No masses or organomegaly were felt.  CENTRAL NERVOUS SYSTEM:  He is alert and oriented x3.  EXTREMITIES:  He has no pedal edema.   LABORATORY AND DIAGNOSTIC DATA:  An ultrasound done in the emergency room  shows gallstones with no acute cholecystitis.  His chest x-ray shows  bronchitic changes consistent with COPD.  His EKG shows a sinus rhythm at 82  beats per minute.  He has first-degree AV block and he has a left bundle  branch block.  He has a left atrial enlargement.  He has questionable ST  segment elevation in V4, V5 and V6.  However, this could be just a J point  elevation.   His amylase and lipase are normal.  PT 16.6, INR 1.3.  Digoxin is 1.1.  Troponin is 0.05, CK is 139, MB is 9.6.  WBC is 10.2, hemoglobin 15.2,  hematocrit 44.5, MCV of 92.3, platelet count of 300.   ASSESSMENT/PLAN:  1. Abdominal pain probably gastroesophageal reflux  disease.  The patient     will be admitted for observation.  He will be given Protonix 40 mg daily.     I will monitor him and see how this improves with his pain.  2. With abdominal pain, cardiac enzymes and his history of coronary artery     disease, his elevated cardiac enzymes could be related to the abdominal     pain above and the patient will be given Lovenox at his admission.  He     will be put on telemetry and we will do serial cardiac enzymes to rule     out any acute myocardial infarction.  Dr. Dietrich Pates, who is his     cardiologist, will be called to come and see for further evaluation.  3. With his congestive heart failure, the patient will be resumed on his     digoxin, his Zaroxolyn, Monopril and amiodarone.  We will monitor him and     see how this improves his condition.  4. For the coronary artery disease, the patient will be continued on his     aspirin and Lopressor.  5. With his history of atrial fibrillation, the patient will be continued on     his Coumadin.  His dosage will be increased to 7.5 daily, and we will     monitor his PT and INR on this admission.  6. For the diabetes mellitus, the patient will be resumed on his Avandia 8     mg daily.  He will also be put on Accu-Chek q.12 h. with regular insulin     coverage.  7. The patient will be admitted under the Hospitalist's service.  Further     workup and management will depend on clinical course.  Sarita Bottom, M.D.    DW/MEDQ  D:  02/26/2002  T:  02/26/2002  Job:  [id number]

## 2010-06-05 NOTE — Discharge Summary (Signed)
NAMELIBERTY, [name]                        ACCOUNT NO.:  [id number]   MEDICAL RECORD NO.:  [id number]                   PATIENT TYPE:  INP   LOCATION:  3707                                 FACILITY:  [facility]   PHYSICIAN:  Jesse Sans. Wall, M.D. [facility]            DATE OF BIRTH:  26-Sep-1927   DATE OF ADMISSION:  DATE OF DISCHARGE:                           DISCHARGE SUMMARY - REFERRING   PROCEDURES:  1. Cardiac catheterization.  2. Coronary arteriogram.  3. Left ventriculogram.   HOSPITAL COURSE:  The patient is a 77 year old male with a history of  coronary artery disease who [place] [facility] [place] and was found to be in atrial fibrillation. He also  has a history of percutaneous intervention to the LAD and RCA. He was  in  [facility] from February 13, 2002, to February 15, 2002, for  management of his ischemic cardiomyopathy and atrial fibrillation. He was  transferred to [facility] for further evaluation and cardiac  catheterization on February 15, 2002.   The patient had a cardiac catheterization on February 16, 2002, which showed  a normal left main and an LAD with approximately 30% in-stent restenosis.  The circumflex had no critical disease and the OM2 had a 50% stenosis. The  RCA had 99% in-stent restenosis with left-to-right collaterals. He had  severely impaired left ventricular systolic function with an EF of 22%, an  inferior akinesis and hypokinesis of the remainder. The films were evaluated  by Dr. Samule Ohm, who felt that medical therapy was the best option.   The next day he had a small hematoma in his groin with minimal ecchymosis  and a possible faint bruit, but there were no clear signs or symptoms of a  pseudoaneurysm and it was considered stable. He then had a lipid profile  checked which showed a total cholesterol of 98, triglycerides 125, HDL 30  and LDL 43. The patient is to continue on Lipitor.   He was  ambulating without chest pain or shortness of breath and considered  stable for discharge on February 17, 2002. He had Aldactone  added to his  medication regimen and had his aspirin decreased to 81 mg q.d. because of  the Coumadin. The patient had been in the [facility] study, but because of the  atrial fibrillation was changed to Coumadin at 5 mg q.d. with a pro time to  be obtained in the [place] office on Monday. He is to discuss with the  office in [place] obtaining blood work as an outpatient in the [place] area  where he lives. His other medications were continued as prior to  admission  with the exception of Glucophage which is to be restarted on Monday,  February 19, 2002.   DISPOSITION:  The patient was  considered stable for discharge on February 17, 2002.   LABORATORY DATA:  Sodium 133, potassium 3.5,  chloride 97, CO2 29, BUN 25,  creatinine 1.0, glucose 96. INR 130, on February 16, 2002, it was 1.76.  Hemoglobin 14.1, hematocrit 41.3, white blood cells 9.4, platelets 255.   DISCHARGE INSTRUCTIONS:  His activity level is to be no driving, sexual  activity or strenuous activity for 2 days. He is to stick to a low fat,  diabetic diet. He is to call  the office for problems with the  catheterization site. He is to get an  INR and Coumadin checked on Monday  and needs to weigh himself daily. He is to get a BMET at 1 week and 3 weeks.   FOLLOW UP:  He is to follow up with Dr. Dewaine Conger as needed. He is to follow up  with Dr. Dietrich Pates and see him in 2 weeks.   DISCHARGE MEDICATIONS:  1. Enteric coated aspirin 81 mg q.d.  2. Lanoxin 0.25 mg q.d.  3. Zestril 20 mg q.d.  4. Aldactone  25 mg q.d.  5. Coumadin 5 mg q.d.  6. Lasix 40 mg b.i.d.  7. Zaroxolyn 2.5 mg q.d.  8. Lopressor 50 mg b.i.d.  9. Avandia 8 mg q.d.  10.      Neurontin 100 mg b.i.d.  11.      Oxycontin 40 mg b.i.d.  12.      Lipitor 40 mg q.d.  13.      Glucophage 1000 mg b.i.d., restart February 19, 2002.  14.       NPH insulin 35 units q. a.m. and 40 q. p.m.  15.      Regular insulin 8 units and 10 units q. p.m.      Lavella Hammock, P.A. [facility]                  Thomas C. Daleen Squibb, M.D. [facility]    RG/MEDQ  D:  02/17/2002  T:  02/17/2002  Job:  [id number]   cc:   Doraville Bing, M.D. [facility]  [address]  [place]  [place] [address]  Fax: 1   Colon Flattery  [address]  [place]  [place] [address]  Fax: [phone]

## 2010-06-05 NOTE — H&P (Signed)
   NAME:  [name], [name]                        ACCOUNT NO.:  [id number]   MEDICAL RECORD NO.:  [id number]                   PATIENT TYPE:  EMS   LOCATION:  ED                                   FACILITY:  [facility]   PHYSICIAN:  Sarita Bottom, M.D.                  DATE OF BIRTH:  Feb 13, 1927   DATE OF ADMISSION:  02/26/2002  DATE OF DISCHARGE:  02/26/2002                                HISTORY & PHYSICAL   ADDENDUM:  The patient has refused admission at this time.  Benefits and  risks of him signing out have been explained to him but he still declines  admission at this time.  He, however, has been advised to see Dr. W________,  who is his cardiologist, for further evaluation on this admission.  The  patient is to be discharged home from the emergency room.  This is an  addendum to an H&P dictated.                                               Sarita Bottom, M.D.    DW/MEDQ  D:  02/26/2002  T:  02/26/2002  Job:  [id number]   cc:   Dewaine Conger, M.D.  [employer]

## 2010-06-05 NOTE — Op Note (Signed)
NAMEMIHCAEL, LEDEE NO.:  [id number]   MEDICAL RECORD NO.:  [id number]          PATIENT TYPE:  INP   LOCATION:  2853                         FACILITY:  [facility]   PHYSICIAN:  Duke Salvia, M.D.  DATE OF BIRTH:  21-Sep-1927   DATE OF PROCEDURE:  09/04/2004  DATE OF DISCHARGE:                                 OPERATIVE REPORT   PREOPERATIVE DIAGNOSES:  1.  Congestive heart failure.  2.  Nonrevascularizable coronary artery disease.  3.  Left bundle branch block.   POSTOPERATIVE DIAGNOSES:  1.  Congestive heart failure.  2.  Nonrevascularizable coronary artery disease.  3.  Left bundle branch block.   PROCEDURE:  Dual-chamber implantable cardioverter-defibrillator implantation  with left ventricular lead placement, without defibrillation threshold  testing.   Following the obtaining of informed consent, the patient was brought to the  electrophysiology laboratory and placed on the fluoroscopic table in supine  position.  After routine prep and drape of the left upper chest, lidocaine  was infiltrated in the prepectoral subclavicular region.  An incision was  made and carried down to the layer of the prepectoral fascia using  electrocautery and sharp dissection.  A pocket was formed similarly.  Hemostasis was obtained.   Thereafter, attention was turned to gaining access to the extrathoracic left  subclavian vein, which was accomplished with moderate difficulty.  Thankfully, neither the artery was punctured nor was air aspirated, and  ultimately two venipunctures were accomplished and double wiring was  undertaken of the more cephalad wire.   Over one of these leads was passed a 7 [place] sheath, through which was then  passed a Medtronic 6949 65-cm active-fixation dual-coil defibrillator lead,  serial number [id number].  Under fluoroscopic guidance it was manipulated to  the right ventricular apex, where the bipolar R-wave was 17 mV with a pacing  impedance  of 1263 Ohms and threshold of 1.1 V at 0.5 msec.  Currented  threshold was 1.0 mA, and there was no diaphragmatic pacing at 10 V.   This lead was secured to the prepectoral fascia, and over the more caudal  guidewire was placed a 9 French sheath, through which was then passed a  Medtronic MB2 coronary sinus cannulation catheter.  The coronary sinus was  cannulated with only minimal difficulty.  Venography was undertaken to  demonstrate a lateral branch.  A Medtronic 4194 88-cm bipolar LV lead,  serial number [id number], was passed through the junction with a Whisper  wire to a site midposition between base and apex on the lateral wall, where  the R-wave was 6.2 mV with a pacing impedance of 1050 Ohms, a threshold of  2.1 V at 0.5 msec, currented threshold was 2.4 mA.  There was no  diaphragmatic pacing at 10 V at this site.  At previous sites more distal,  10 volts had caused diaphragmatic stimulation.   At this point the 9 French sheath was removed, the MB2 sheath was left in  place, and over the last retained guidewire was placed a 7 and then upsized  to an 8 [place] safe sheath that allowed for  passage and manipulation of the  Medtronic 5076 52-cm active-fixation atrial lead, serial number [id number].  Under fluoroscopic guidance this lead was manipulated to the right atrial  appendage, where there was great sensing but no capture, so we ended up  having to put the lead a little bit more proximally in the right atrial  appendage, where the bipolar P-wave was 2.6 mV with a pacing impedance of  1263 Ohms and a threshold of 1.1 V at 0.5 msec and currented threshold was 1  mA.  With these acceptable parameters recorded, the MB2 sheath was removed,  position was reaffirmed, the leads were secured to the prepectoral fascia  and then attached to a Medtronic InSync Sentry 7299 ICD, serial number  [id number].  Through the device the bipolar R-wave was 2 with a pacing  threshold of 1 V at 0.6  with impedance of 592.  The R-wave was 18 with a  pacing impedance of 968, a threshold of 0.5 at 0.5, and the LV threshold was  2 V at 0.8 with impedance of 592.  The proximal coil impedance was 49 and  the distal coil impedance was 59.   With these acceptable parameters recorded, the system was implanted.  The  pocket was copiously irrigated with antibiotic-containing saline solution,  hemostasis was assured, and the leads and the pulse generator were placed in  the pocket and secured to the prepectoral fascia.  The patient had  nonrevascularizable coronary artery disease, it was elected not to undertake  DFT testing as surgery was not an option.   The patient's device is programmed with first VT therapies at __________  joules and all other therapies programmed at 31 joules.  Antitachycardia  pacing programs are also activated.           ______________________________  Duke Salvia, M.D.     SCK/MEDQ  D:  09/04/2004  T:  09/04/2004  Job:  [id number]   cc:   Delaney Meigs, M.D.  [address]  [place]  [place] [address]  Fax: [phone]   [facility] office

## 2010-06-05 NOTE — Consult Note (Signed)
NAME:  HAIK, MAHONEY                          ACCOUNT NO.:  [id number]   MEDICAL RECORD NO.:  [id number]                  PATIENT TYPE:   LOCATION:                                       FACILITY:   PHYSICIAN:  R. Roetta Sessions, M.D.              DATE OF BIRTH:  08-25-1927   DATE OF CONSULTATION:  03/14/2002  DATE OF DISCHARGE:                                   CONSULTATION   REFERRING PHYSICIAN:  Vida Roller, M.D.   CHIEF COMPLAINT:  Recent episode of epigastric/chest pain.   HISTORY OF PRESENT ILLNESS:  The patient is a pleasant 77 year old gentleman  with a history of coronary disease, diminished left ventricular ejection  fraction, history of atrial fibrillation, on Coumadin and aspirin.  Seen at  the request of Dr. Dionicio Stall to further evaluate long-standing reflux  symptoms and recent bout of epigastric retrosternal chest pain.  The patient  was recently admitted to the hospital in a bout of congestive heart failure.  He was discharged.  A six day period after discharge he had some  retrosternal burning which he felt was similar to the burning which he  experiences when he is injected with contrast during a cardiac  catheterization.  He felt it was somewhat different then his typical almost  daily symptoms of gastroesophageal reflux disease.  He also had some  epigastric pain and was unable to eat because of this.  Specifically, he  felt he was unable to swallow any food.  He did not have any nausea or  vomiting.  Symptoms waxed and waned for approximately six days and then  subsided.  By report, he has a history of gallstones on an ultrasound  recently.  I do not have a copy of that report to check ductal dilation,  etc.  It is unknown to me whether or not he had any elevation of his liver  functions or serum amylase from the H&P from this admission in early  February.  He was started on Protonix 40 mg daily, however, reviewing his  medications at this point in time,  he does not seem to be on any acid  suppression therapy.  He has occasional dysphagia to liquids, but denies  dysphagia to solids.  He has not had any nausea or vomiting, no melena, no  rectal bleeding.  He has taken Alka-Seltzer's and Rolaids on a regular basis  chronically for reflux symptoms.  He is not currently having any symptoms  consistent with angina.  He has never had an esophagogastroduodenoscopy nor  has he ever had a colonoscopy per his report.   PAST MEDICAL HISTORY:  1. Cardiomyopathy.  Left ventricular ejection fraction reportedly 22 to 23%.  2. Atrial fibrillation.  3. History of myocardial infarction.  4. Diabetes mellitus, on insulin.  5. Lumbar disk disease for which he takes OxyContin.  6. Peripheral vascular disease.   PAST SURGICAL HISTORY:  1.  Status post coronary catheterization with stenting in 1997.  2. Bladder cancer some 12 years ago.   MEDICATIONS:  1. Coated aspirin 81 mg daily.  2. Lanoxin 0.25 mg daily.  3. Lopressor 50 mg t.i.d.  4. Lisinopril 20 mg daily.  5. Aldactone 25 mg daily.  6. Coumadin as directed.  7. Lasix 40 mg p.o. b.i.d.  8. Avandia 8 mg b.i.d.  9. Glucophage 1 g b.i.d.  10.      Lipitor 10 mg daily.  11.      OxyContin 40 mg b.i.d.  12.      Neurontin 100 mg b.i.d.  13.      NPH 35 units q.a.m. and 40 units q.p.m.  14.      Regular 8 units q.a.m. and 10 units q.p.m.   ALLERGIES:  PENICILLIN.   FAMILY HISTORY:  Father died with some pulmonary problems.  Mother had a  heart attack.  No history of chronic GI or liver disease.   SOCIAL HISTORY:  The patient has been married for 32 years with six  children.  He is retired from [employer].  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  He stopped smoking in 1996, no alcohol.   REVIEW OF SYMPTOMS:  As in history of present illness.   PHYSICAL EXAMINATION:  GENERAL:  This is a pleasant 77 year old gentleman  resting comfortably.  VITAL SIGNS:  Weight 206, height 6 feet,  temperature 97.4, blood pressure  120/60, pulse 86.  SKIN:  Warm and dry.  No jaundice, __________,  or stigmata of chronic liver  disease.  HEENT:  No scleral icterus.  Conjunctivae are pink.  Oral cavity:  He has a  plate above and a partial below.  No oral lesions.  JVD is not prominent.  CHEST:  Lungs are clear to auscultation.  CARDIAC:  Regular rate and rhythm with a 2/6 systolic ejection murmur at  left sternal border.  ABDOMEN:  Nondistended, positive bowel sounds, soft, nontender without  appreciable mass or organomegaly.   ASSESSMENT:  The patient is a pleasant 77 year old gentleman with multiple  problems including peripheral vascular disease, cardiomyopathy, diabetes,  who has chronic gastroesophageal reflux symptoms, currently on no prescribed  acid suppression therapy.  He had a recent bout of epigastric and  retrosternal discomfort which he differs somewhat from his typical reflux  symptoms which lasted nearly six days.   By report, he has gallstones on ultrasound.   His recent acute GI symptoms sound somewhat atypical for gastroesophageal  reflux disease alone.  I wonder if he has a common duct stone occultly and  had a bout of pancreatitis alternatively, if he developed a pill impaction  in his distal esophagus symptoms would be similar in nature.   He really needs to be on concomitant acid suppression in the setting of  aspirin and Coumadin therapy.  His long-standing reflux symptoms and recent  acute illness warrant further evaluation.   RECOMMENDATIONS:  1. To this end, I have offered the patient an esophagogastroduodenoscopy to     further evaluate his symptoms.  We will go ahead and check a set of liver     function tests, amylase, and CBC today.  I have taken the liberty of     giving him samples of Protonix 40 mg tablets.  He is to take one tablet     daily.  Two week supply given. 2. I would very much like to review the gallbladder ultrasound  which was      reportedly done in the last few weeks.   I have taken the liberty to discuss the case with Dr. Juanito Doom.  He was in  the [facility] today.  Given his history of mitral regurgitation, heart  murmur, cardiomyopathy, and atrial fibrillation, it was mixly agreed that  Lovenox bridging would be in this gentleman's best interest while holding  the Coumadin.  Dr. Daleen Squibb has kindly agreed to coordinate this through the  [facility].  He will receive SBE  prophylaxis as well for  esophagogastroduodenoscopy.  We will make plans to perform  esophagogastroduodenoscopy later next week.  Further recommendations to  follow.   I would like to thank Dr. Dionicio Stall for letting me assist with this nice  gentleman.                                                Jonathon Bellows, M.D.    RMR/MEDQ  D:  03/14/2002  T:  03/14/2002  Job:  [id number]   cc:   Vida Roller, M.D.  Fax: [phone]   R. Roetta Sessions, M.D.  [address]  [place]  [place] [address]  Fax: [phone]

## 2010-06-05 NOTE — Discharge Summary (Signed)
NAMEMIVAAN, [name] NO.:  [id number]   MEDICAL RECORD NO.:  [id number]          PATIENT TYPE:  INP   LOCATION:  3710                         FACILITY:  [facility]   PHYSICIAN:  Duke Salvia, M.D.  DATE OF BIRTH:  11/28/1927   DATE OF ADMISSION:  09/04/2004  DATE OF DISCHARGE:  09/05/2004                                 DISCHARGE SUMMARY   ADDENDUM   The patient is being discharged home after seen by Dr. Sherryl Manges on  09/05/04.  The patient is status post pacemaker with hematoma and ecchymosis  at pacer site, pressure held x 10 minutes.  Dr. Graciela Husbands will see patient at  our office on Monday  morning at 8:30 for reevaluation and reassess.  Also,  in medications, the Lasix has been decreased to 80 mg daily.  All other  discharge instructions as previously stated by Dr. Loura Pardon.      Dorian Pod, NP    ______________________________  Duke Salvia, M.D.    MB/MEDQ  D:  09/05/2004  T:  09/05/2004  Job:  [id number]   cc:   Yvonne Kendall.D.

## 2010-06-05 NOTE — Discharge Summary (Signed)
NAMEDERIC, BOCOCK NO.:  [id number]   MEDICAL RECORD NO.:  [id number]          PATIENT TYPE:  INP   LOCATION:  3710                         FACILITY:  [facility]   PHYSICIAN:  Duke Salvia, M.D.  DATE OF BIRTH:  1927-11-02   DATE OF ADMISSION:  09/04/2004  DATE OF DISCHARGE:  09/05/2004                                 DISCHARGE SUMMARY   This gentleman has allergies to PENICILLIN and IV DYE.   DISCHARGE DIAGNOSIS:  Discharging day #1, status post implantation of  [employer] [facility] SENTRY cardioverter-defibrillator, serial number  [id number].   SECONDARY DIAGNOSES:  1.  History of non-ST elevated myocardial infarction December 2005.  2.  Ischemic cardiomyopathy, ejection fraction 20-25%.  3.  Recent catheterization August 05, 2004.  The right coronary artery is      subtotally occluded, feeds by left-to-right collaterals.  The stent in      the left anterior descending coronary artery is patent.  The diagonal      had previous angioplasty and is patent.  Lesions in obtuse marginal 1      and obtuse marginal 2 are stable since catheterization 2004.  4.  History of paroxysmal atrial fibrillation/flutter on chronic Coumadin      therapy.  5.  Left bundle branch block.  6.  Severe chronic obstructive pulmonary disease/emphysema/home oxygen.  7.  Obstructive sleep apnea, claustrophobia with continuous positive airway      pressure.  8.  Hypertension.  9.  Diabetes, insulin-dependent.  10. Dyslipidemia.  11. Chronic renal insufficiency.   PROCEDURE:  September 04, 2004, implantation of cardioverter-defibrillator with  left ventricular lead placed for cardiac resynchronization therapy by Duke Salvia, M.D.  This was a [employer].  The patient had no  postprocedural complications.   DISCHARGE DISPOSITION:  Mr. Juwann Sherk discharging the day after  implantation of cardioverter-defibrillator with left ventricular lead  placed.  His incision is  healing nicely.  There is no swelling, no  ecchymosis, no drainage.  His pain is controlled with oral analgesia.  Bandage has been removed on day 1, and the incision is kept open to the air.  He is asked to keep his incision dry for the next seven days, to sponge-  bathe until Friday, August 25.  He has been afebrile.  His heart rate has  been controlled with medication therapy.  Mobility of the left upper  extremity as well as incision care have been discussed with the patient.   He discharges on the following medications:  1.  Coumadin 5 mg tablets 1-1/2 tablets daily.  2.  Insulin Humulin N 30 units in the evening, 40 units in the morning,      Humulin R 10 units in the evening, 8 units in the morning.  3.  Protonix 40 mg daily.  4.  Digitek 0.125 mg one-half of a tablet every other day.  5.  Avapro 300 mg daily.  6.  Imdur 60 mg daily.  7.  Enteric-coated aspirin 81 mg daily.  8.  Furosemide 80 mg twice daily.  9.  Neurontin 100  mg at bedtime.  10. Oxycodone CR 40 mg twice daily.  11. Lipitor 10 mg one-half tablet daily at bedtime.  12. Metoprolol 50 mg one-half tablet daily.  13. Spironolactone 25 mg daily.  14. Amiodarone 200 mg daily at bedtime.  15. Metformin 1000 mg twice daily.  He is asked not to take metformin until      Monday, August 21.  16. Avandia 8 mg daily.  We are trying to get him off this medication      secondary to his severe congestive heart failure symptoms.  We feel his      congestive heart failure symptoms are III, and he is on maximum medical      therapy.  He has no history of syncope or presyncope.   Mr. Mattioli has follow-up at the [facility] clinic, [facility], [address] Tuesday, September 5, at 9:30, and he will see Dr. Graciela Husbands  Tuesday, November 14, at 10:20.   BRIEF HISTORY:  Mr. Ravenscroft is a 77 year old male.  He has a history of  ischemic cardiomyopathy, chronic renal insufficiency.  He has had multiple  interventions in the  past.  Cardiac catheterization on August 05, 2004, shows  that he has a patent stent in the LAD.  It also shows that lesions in the  first obtuse marginal and second obtuse marginal are little changed from a  catheterization done in 2004.  His right coronary artery is subtotally  occluded and fed by left-to-right collaterals.  The patient does not have  anginal symptoms, and his ejection fraction is estimated at 20-25%.  He has  class III congestive heart failure symptoms, and he has had a myocardial  infarction in December 2005.  He also has left bundle branch block.  The  benefits of cardiac resynchronization therapy as well as cardioverter-  defibrillator implantation have been discussed with the patient, and he is  willing to proceed.  He is admitted electively August 18.   HOSPITAL COURSE:  The patient was electively admitted August 18.  He  underwent implantation of cardioverter-defibrillator with left ventricular  lead.  The procedure was successful, the patient discharging the next day  with no complications.      Maple Mirza, P.A.    ______________________________  Duke Salvia, M.D.    GM/MEDQ  D:  09/04/2004  T:  09/05/2004  Job:  [id number]   cc:   Delaney Meigs, M.D.  [address]  [place]  [place] [address]  Fax: [phone]   Learta Codding, M.D. [facility]  [address]  [address]  [place]  [place] [address]

## 2010-06-05 NOTE — Discharge Summary (Signed)
NAMEJOON, POHLE NO.:  [id number]   MEDICAL RECORD NO.:  [id number]          PATIENT TYPE:  INP   LOCATION:  2007                         FACILITY:  [facility]   PHYSICIAN:  Olga Millers, M.D. LHCDATE OF BIRTH:  12/05/1927   DATE OF ADMISSION:  11/01/2003  DATE OF DISCHARGE:  11/06/2003                                 DISCHARGE SUMMARY   PROCEDURES:  None.   HOSPITAL COURSE:  Mr. Centola is a 77 year old male with known ischemic  cardiomyopathy.  He went to [facility] for a lap chole and was seen  by cardiology on postop day #1 for PAF.  He had pulmonary edema with  significant hypertension.  He required IV Lasix.  He was transferred to  [facility] for further evaluation and treatment.   His pulmonary edema improved.  His Lasix was decreased from 80 mg IV b.i.d.  to 40 mg IV b.i.d. and then he was placed on a discharge dose of Lasix at 80  mg p.o. b.i.d.  He tolerated this well.   He had another episode of atrial fibrillation and was placed on IV  amiodarone.  This was transitioned to p.o. amiodarone and he is going home  on this medication.   Mr. Yabut had been on Coumadin prior to admission and his INR at discharge  is 1.2.  It is expected that his INR will increase more quickly because of  the amiodarone and his Coumadin dosage will be decreased.  He will be  followed closely as an outpatient.   Mr. Landin had been on Ancef at [facility].  This was continued but he was  changed to p.o. Keflex.  He will continue the p.o. Keflex through Sunday and  stop.   Mr. Bowker had a mild increase in his troponin-I at [facility]  However, Dr.  Andee Lineman reviewed the records and felt like that this was in the setting of  acute pulmonary edema, causing moderate __________ leakage.  He did not feel  that any recatheterization was required at this time.  He is to be followed  closely in the office and consideration for an ischemia workup can be given  if  the patient still has symptoms after recovery.   By November 06, 2003, Mr. Mindel was feeling very much better.  His O2  saturation was 91% on 2 liters but he is on home O2 for COPD.  He was  ambulating without chest pain or shortness of breath and his BNP was near  normal at 272.  Dr. Andee Lineman evaluated Mr. Wilsey and felt that he was stable  for discharge on November 06, 2003, with outpatient followup arranged.   DISCHARGE DIAGNOSES:  1.  Cholecystectomy at [facility].  2.  Status post postoperative pulmonary edema with mild increase in      creatinine.  3.  Ischemic heart disease/cardiomyopathy with an ejection fraction of 22%      (occluded right coronary artery with left-to-right collaterals status      post stent to the left anterior descending and a recent nonischemic      Cardiolite, catheterization  deferred for now).  4.  Paroxysmal atrial fibrillation and paroxysmal atrial flutter, now in      sinus rhythm on amiodarone.  5.  Status post conversion pauses of approximately 2.4 seconds with blocked      premature atrial contractions, no recurrence, treating with beta-blocker      and amiodarone.  6.  Anticoagulation with Coumadin.  7.  Chronic obstructive pulmonary disease on home oxygen.  8.  Chronic left bundle branch block.  9.  Hypertension status post intravenous nitroprusside.  10. ALLERGIES TO PENICILLIN, CELEBREX, INTRAVENOUS DYE.  11. History of bladder cancer in 1990 and stents in 1996.  12. History of neuropathy.  13. Diabetes.  14. Hyperlipidemia.   DISCHARGE INSTRUCTIONS:  1.  His activity level is to include no strenuous activity.  2.  He is to stick to a low fat diabetic diet.  3.  He is to call the surgeon for any problems with his incisions.  4.  He is to followup with Dr. Andee Lineman on Friday at 1:15 and get a pro time      at that time.   DISCHARGE MEDICATIONS:  1.  Aldactone 25 mg daily.  2.  Avandia 8 mg daily.  3.  Avapro 300 mg daily.  4.   Amiodarone 200 mg 2 tabs daily.  5.  Coumadin 5 mg daily.  6.  Coated aspirin 81 mg daily.  7.  Glucophage 1000 mg b.i.d.  8.  Imdur 60 mg daily.  9.  Lanoxin 0.125 mg q.o.d.  10. Lasix 80 mg b.i.d.  11. Lopressor 25 mg b.i.d.  12. Insulin as prior to admission.  13. Protonix 40 mg daily.  14. Keflex 500 mg b.i.d. through Sunday.  15. Neurontin and oxycodone and inhalers as prior to admission.       RB/MEDQ  D:  11/06/2003  T:  11/06/2003  Job:  [id number]   cc:   Delaney Meigs, M.D.  [address]  [place]  [place] [address]  Fax: [phone]   Sherilyn Cooter A. Cleotis Nipper, M.D.  [address]  [place]  [place] [address]  Fax: [phone]   [facility]  [address]  [address]  [place], [place] [address]

## 2010-06-05 NOTE — Cardiovascular Report (Signed)
NAMEMarland Kitchen  [name], [name]                        ACCOUNT NO.:  [id number]   MEDICAL RECORD NO.:  [id number]                   PATIENT TYPE:  INP   LOCATION:  3707                                 FACILITY:  [facility]   PHYSICIAN:  Salvadore Farber, M.D. [facility]         DATE OF BIRTH:  February 02, 1927   DATE OF PROCEDURE:  02/16/2002  DATE OF DISCHARGE:                              CARDIAC CATHETERIZATION   PROCEDURE:  Left and right heart catheterization, left ventriculography,  coronary angiography.   INDICATION:  The patient is a 77 year old gentleman with ischemic  cardiomyopathy, status post stenting of the RCA in 1996 and subsequent  stenting of the LAD with balloon angioplasty of the diagonal branch in 1999.  Ejection fraction has most recently been in the 30-35% range.  Over the past  several weeks, he has had worsening heart failure symptoms for which  metolazone was recently added.  He then presented to the hospital with rapid  atrial fibrillation.  In that setting, he had a subendocardial myocardial  infarction with peak CK of 197 and troponin of 0.15.  Due to his worsening  heart failure and this small subendocardial MI, he was referred for  diagnostic angiography.   DIAGNOSTIC TECHNIQUE:  Informed consent was obtained.  Under 1% lidocaine  local anesthesia, a 6-French sheath was placed in the right femoral artery  and an 8-French sheath in the right femoral vein.  A balloon-tipped catheter  was advanced to the pulmonary capillary wedge position.  Pressures were  measured.  A pigtail catheter was advanced into the left ventricle.  Simultaneous pressures were obtained.  Cardiac output was determined using  the thermodilution method.  Diagnostic angiography was performed using JL4  and JR4 catheters.  Ventriculography was performed in the RAO projection  using a pigtail catheter.  The patient tolerated the procedure well and was  transferred to the holding room in stable condition.   Sheaths are to be  removed there.   COMPLICATIONS:  None.   FINDINGS:  1. Hemodynamics:  RA 6, RV 30/4/7, PA 30/13/20, PCW 9/9/8, LV 128/5/11.  2. There is no mitral stenosis or aortic stenosis.  3. Ventriculography:  EF 22% with inferior akinesis, severe hypokinesis of     the remainder of the ventricle.  The ventricle is markedly dilated.     There is no mitral regurgitation.  4. Coronary angiography:     a. Left main:  Angiographically normal.     b. LAD:  The LAD is a large vessel, giving rise to two diagonal branches.        There is a stent in the mid LAD, arising after the second diagonal.        This stent has approximately 30% in-stent restenosis.  There are mild        luminal irregularities of the remainder of the vessel.  The previously        intervened upon diagonal was  widely patent.     c. Circumflex:  The circumflex is a large vessel, giving rise to two        obtuse marginal branches.  The first sizable obtuse marginal has a 50%        stenosis proximally.     d. RCA:  The RCA is a moderate-sized dominant vessel.  The proximal        vessel has diffuse mild-to-moderate disease.  There is a stent in the        mid-vessel which has approximately 99% in-stent restenosis.  The        distal vessel is fed via robust collaterals from the left.   IMPRESSION/RECOMMENDATIONS:  1. Dilated left ventricle with severely impaired left ventricular systolic     function, ejection fraction of 22%.  2. Normal right heart pressures.  3. No mitral stenosis, aortic stenosis, mitral regurgitation.  4. Subtotal occlusion of the mid right coronary artery due to in-stent     restenosis.  However, the inferior wall is akinetic and the distal right     coronary artery is collateralized from the left.  There is mild disease     of the left system.   We will plan continued medical therapy for his ischemic cardiomyopathy.  His  subendocardial myocardial infarction in the setting of rapid  atrial  fibrillation was likely due to demand ischemia to the peri-infarct zone of  the inferior wall and not to collateral insufficiency.  If this situation  recurs, he may benefit from the addition of nitrates.                                               Salvadore Farber, M.D. [facility]    WED/MEDQ  D:  02/16/2002  T:  02/16/2002  Job:  [id number]   cc:   Erasmo Downer M.D.   Jonelle Sidle, M.D. [facility]  [address]  [place]  [place] [address]  Fax: [phone]

## 2010-06-05 NOTE — Cardiovascular Report (Signed)
NAMEARAS, ALBARRAN NO.:  [id number]   MEDICAL RECORD NO.:  [id number]          PATIENT TYPE:  OIB   LOCATION:  6501                         FACILITY:  [facility]   PHYSICIAN:  Arvilla Meres, M.D. LHCDATE OF BIRTH:  [birth year]   DATE OF PROCEDURE:  08/05/2004  DATE OF DISCHARGE:                              CARDIAC CATHETERIZATION   PRIMARY CARE PHYSICIAN:  Dr. Linna Darner in [place], [place].   CARDIOLOGISTS:  1.  Learta Codding, M.D.  2.  Duke Salvia, M.D.   PATIENT IDENTIFICATION:  Mr. Kamarrion is a very pleasant 77 year old male  with a history of ischemic cardiomyopathy and chronic renal insufficiency  who is status post multiple previous stent placements. He is now being  considered for ICD placement, however, he did sustain a non-ST elevation  myocardial infarction in the setting of gallbladder surgery in December and  thus he is referred for cardiac catheterization for evaluation of his  coronary anatomy prior to defibrillator implantation and testing. Of note,  he is not currently having any exertional chest pain.   PROCEDURES PERFORMED:  1.  Selective coronary angiography.  2.  Left heart catheterization.  3.  Angio-Seal femoral artery closure. Left ventriculogram was not performed      secondary chronic renal insufficiency..   DESCRIPTION OF PROCEDURE:  The risks and benefits of the catheterization  were explained to Mr. Wiegel; consent was signed and placed on the chart.  Given his history of dye allergies and chronic renal insufficiency, he was  pretreated with a bicarbonate drip. He was also given a steroid prep as well  as Benadryl and Tagamet.   A 4-French sheath was initially placed in the right femoral artery using a  modified Seldinger technique. However, we were fully unable to fully opacify  the vessels, thus we upgraded to a 6-French sheath. A JL-5, JR-4 and angled  pigtail were used for the catheterization. All catheter exchanges  were made  over a wire. At the end the procedure, the patient's arteriotomy site was  closed with Angio-Seal device. Good hemostasis was obtained. There were no  apparent complications.   FINDINGS:  Central aortic pressure is 145/60 with a mean of 96. LV was  149/12 with an EDP of 18-20. There is no significant gradient on aortic  valve pullback.   CORONARY ANATOMY:  Left main: Angiographically normal.   LAD was a long vessel giving off two moderate to large diagonals. In the  proximal LAD, there was a 30-40% tubular stenosis. In the mid portion of the  LAD after the takeoff of second diagonal there is a 30% in-stent restenosis  of the previously placed stent. Following this, there was a 30% tubular  stenosis and then multiple luminal irregularities. In the first diagonal  there was a 40% ostial lesion, in the second diagonal there was a 30%  tubular proximal lesion. There were multiple septal perforators with  collaterals to the distal right coronary artery and PDA.   Left circumflex: Was a large vessel. It gave off a tiny OM1 and large  branching OM 2 and a tiny  OM 3. There was a 30% tubular stenosis in the  proximal left circumflex and a 30% stenosis in the distal left circumflex.  In the large OM 2 there was a 40% proximal lesion. There was then 70-75%  focal lesion in the mid to distal vessel which was essentially unchanged  from 2004.   The right coronary artery was diffusely diseased in the proximal portion.  There was a stent in the mid portion with subtotaled in-stent restenosis. As  above, there was good left-to-right collaterals filling the distal vessel.  This was unchanged from 2004.   ASSESSMENT:  1.  Subtotaled right coronary artery with left-to-right collaterals.  2.  Moderate nonobstructive disease in the left anterior descending with a      patent stent. The previously angioplastied diagonal also looks good.  3.  Borderline lesion in the distal obtuse marginal 1  which is little change      since 2004.  4.  Normal to slightly elevated filling pressures.   DISCUSSION:  Mr. Crager has two-vessel CAD with an ischemic cardiomyopathy.  There has been little change in his coronary anatomy since 2004.  He does  have borderline lesion in the distal OM 2 territory, however, this does not  feed a large amount of myocardium and as he is not experiencing any anginal  symptoms, I think it is reasonable to continue with medical management and  to proceed with his ICD.       DB/MEDQ  D:  08/05/2004  T:  08/05/2004  Job:  [id number]   cc:   Dr. Juventino Slovak, [place]   Learta Codding, M.D. [facility]  [address]  [address]  [place]  [place] [address]   Duke Salvia, M.D.

## 2010-06-05 NOTE — Procedures (Signed)
   NAMEGRAYSON, PFEFFERLE                        ACCOUNT NO.:  [id number]   MEDICAL RECORD NO.:  [id number]                   PATIENT TYPE:  OUT   LOCATION:  RESP                                 FACILITY:  [facility]   PHYSICIAN:  Edward L. Juanetta Gosling, M.D.             DATE OF BIRTH:  08-12-1927   DATE OF PROCEDURE:  DATE OF DISCHARGE:                              PULMONARY FUNCTION TEST   IMPRESSION:  1. Spirometry shows mild ventilatory defects with air flow obstruction.  2. There could be a separate restrictive change but I do not see evidence of     this on this tracing.                                               Edward L. Juanetta Gosling, M.D.    ELH/MEDQ  D:  05/08/2002  T:  05/09/2002  Job:  [id number]   cc:   Colon Flattery, MD  [address]  [place]  [place] [address]  Fax: [phone]

## 2010-06-05 NOTE — Op Note (Signed)
NAME:  [name], [name]                        ACCOUNT NO.:  [id number]   MEDICAL RECORD NO.:  [id number]                   PATIENT TYPE:  AMB   LOCATION:  DAY                                  FACILITY:  [facility]   PHYSICIAN:  R. Roetta Sessions, M.D.              DATE OF BIRTH:  04/24/1927   DATE OF PROCEDURE:  03/23/2002  DATE OF DISCHARGE:                                 OPERATIVE REPORT   PROCEDURE:  Diagnostic esophagogastroduodenoscopy.   INDICATIONS FOR PROCEDURE:  The patient is a 77 year old man with multiple  medical problems including cardiomyopathy recently seen for epigastric  retrosternal chest discomfort which has differed somewhat from his typical  reflux symptoms. I saw this gentleman in the office on March 14, 2002. I  started him on acid suppression in the way of Protonix 40 mg orally daily.  He tells me that this has been associated with a marked improvement in his  symptoms. He has essentially become asymptomatic over the past two weeks. By  report, he has cholelithiasis on ultrasound. CBC, LFTs and amylase are  pending. EGD is now being done to evaluate his upper GI tract. This approach  has been discussed with the patient previously. The potential risks,  benefits, and alternatives have been reviewed and consultation with Dr.  Daleen Squibb. Coumadin was stopped three days ago. His pro time today is 13.5  seconds, INR 1.0. He has been bridged with Lovenox per Dr. Vern [facility] clinic.  This approach has been discussed the patient previously and again today at  the bedside. Please see my March 14, 2002 consultation note for more  information.   MONITORING:  O2 saturation, blood pressure, pulse, and respirations were  monitored throughout the entire procedure.   CONSCIOUS SEDATION:  Versed 3 mg IV, Demerol 25 mg IV.  Cetacaine spray for  topical oropharyngeal anesthesia.   INSTRUMENTS:  Olympus video chip adult gastroscope.   FINDINGS:  Examination of the tubular  esophagus revealed no mucosal  abnormalities. The EG junction was easily traversed.   STOMACH:  The gastric cavity was empty and insufflated well with air. A  thorough examination of the gastric mucosa including a retroflexed view of  the proximal stomach and esophagogastric junction demonstrated no  abnormality. The pylorus was patent and easily traversed.   DUODENUM:  The bulb and second portion appeared normal.   THERAPEUTIC/DIAGNOSTIC MANEUVERS:  None.   The patient tolerated the procedure well and was reacted in endoscopy.   IMPRESSION:  1. Normal esophagus.  2. Normal stomach.  3. Normal D1 and D2.   [name] has had a dramatic response to acid suppression therapy. I  suspect the majority of his recent symptoms are indeed secondary to  gastroesophageal reflux. I am more inclined to think his cholelithiasis by  standard finding at this time.   RECOMMENDATIONS:  1. CBC, amylase and liver profile remain pending.  2. He is to continue  Protonix 40 mg orally daily indefinitely (particularly     if he is to stay on aspirin while taking Coumadin).  3. Resume Coumadin today.  4. Continue Lovenox injections until told to stop by Dr. Anthonette Legato and     associates.  5. Appointment to see me back in the office in one month.                                               Jonathon Bellows, M.D.    RMR/MEDQ  D:  03/23/2002  T:  03/24/2002  Job:  [id number]   cc:   Vida Roller, M.D.  Fax: [phone]

## 2010-06-09 ENCOUNTER — Ambulatory Visit (INDEPENDENT_AMBULATORY_CARE_PROVIDER_SITE_OTHER): Payer: Medicare Other | Admitting: *Deleted

## 2010-06-09 DIAGNOSIS — Z7901 Long term (current) use of anticoagulants: Secondary | ICD-10-CM

## 2010-06-09 DIAGNOSIS — I4891 Unspecified atrial fibrillation: Secondary | ICD-10-CM

## 2010-06-09 LAB — POCT INR: INR: 2.7

## 2010-06-22 ENCOUNTER — Other Ambulatory Visit: Payer: Self-pay | Admitting: *Deleted

## 2010-06-22 MED ORDER — ISOSORBIDE MONONITRATE ER 60 MG PO TB24: 60.0000 mg | ORAL_TABLET | Freq: Every day | ORAL | Status: DC

## 2010-06-24 ENCOUNTER — Encounter: Payer: Self-pay | Admitting: Internal Medicine

## 2010-06-25 ENCOUNTER — Ambulatory Visit (INDEPENDENT_AMBULATORY_CARE_PROVIDER_SITE_OTHER): Payer: Medicare Other | Admitting: Internal Medicine

## 2010-06-25 ENCOUNTER — Encounter: Payer: Self-pay | Admitting: Internal Medicine

## 2010-06-25 DIAGNOSIS — I509 Heart failure, unspecified: Secondary | ICD-10-CM

## 2010-06-25 DIAGNOSIS — I428 Other cardiomyopathies: Secondary | ICD-10-CM

## 2010-06-25 DIAGNOSIS — I5022 Chronic systolic (congestive) heart failure: Secondary | ICD-10-CM

## 2010-06-25 DIAGNOSIS — I4891 Unspecified atrial fibrillation: Secondary | ICD-10-CM

## 2010-06-25 DIAGNOSIS — I10 Essential (primary) hypertension: Secondary | ICD-10-CM

## 2010-06-25 DIAGNOSIS — I2589 Other forms of chronic ischemic heart disease: Secondary | ICD-10-CM

## 2010-06-25 NOTE — Assessment & Plan Note (Signed)
He is appropriately anticoagulated with coumadin No changes today

## 2010-06-25 NOTE — Progress Notes (Signed)
The patient presents today for routine electrophysiology followup.  Since last being seen in our clinic, the patient reports doing reasonably well.  He is quite limited by COPD and has O2 which he uses at home.  He also has dementia.  His wife feels that his quality of life is not the best.  Today, he denies symptoms of palpitations, chest pain, shortness of breath (above baseline), dizziness, presyncope, syncope, or neurologic sequela.  The patient feels that he is tolerating medications without difficulties and is otherwise without complaint today.   Past Medical History  Diagnosis Date  . Systolic heart failure     chronic (EF was 20%,50% echo 2007)  . ICD (implantable cardiac defibrillator) battery depletion     in situ, RV lead fractured (1610 fidelis lead) and therefore now programmed with VT/VF therapies off  . Cardiomyopathy     ischemic  . Atrial fibrillation   . Mitral regurgitation   . CAD (coronary artery disease)     unspecifide coronary artery disease status post multiple percutaneous coronary intervention in the past last cath in 07/2004: totally occluded RCA with left to right collaterals ,and a 70-75% mid to distal obtuse marginal stenosis medical therapy  . Hypertension   . Hyperlipidemia   . COPD (chronic obstructive pulmonary disease)     has O2 at home  . GERD (gastroesophageal reflux disease)   . Diabetes mellitus   . Sleep apnea   . Anemia   . Dementia    Past Surgical History  Procedure Date  . Insert / replace / remove pacemaker     medtronic insync sentry 7299-09/04/2004  . Cholecystectomy   . Cystoscopy     for bladder cancer years sgo    Current Outpatient Prescriptions  Medication Sig Dispense Refill  . acetaminophen (TYLENOL) 500 MG tablet Take 500 mg by mouth every 12 (twelve) hours.       Marland Kitchen amiodarone (PACERONE) 200 MG tablet Take 100 mg by mouth daily.       Marland Kitchen aspirin 81 MG tablet Take 81 mg by mouth daily.        Marland Kitchen atorvastatin (LIPITOR) 10 MG  tablet Take 10 mg by mouth daily.        . B Complex-C-Folic Acid (RENAL MULTIVITAMIN FORMULA PO) Take 1 capsule by mouth daily.        . diazepam (VALIUM) 5 MG tablet Take 5 mg by mouth every 8 (eight) hours as needed.       . digoxin (LANOXIN) 0.125 MG tablet Take 125 mcg by mouth every other day.       . DULoxetine (CYMBALTA) 60 MG capsule Take 60 mg by mouth daily.        . ergocalciferol (VITAMIN D2) 50000 UNITS capsule Take 50,000 Units by mouth once a week.        . fluticasone (FLONASE) 50 MCG/ACT nasal spray 2 sprays by Nasal route daily.        . furosemide (LASIX) 80 MG tablet Take 120 mg by mouth 2 (two) times daily.       Marland Kitchen gabapentin (NEURONTIN) 100 MG capsule Take 100 mg by mouth daily.        . insulin NPH (HUMULIN N) 100 UNIT/ML injection Inject 55 Units into the skin 2 (two) times daily.       . insulin regular (HUMULIN R,NOVOLIN R) 100 UNIT/ML injection Inject into the skin daily. 7-10 units bid        . irbesartan (AVAPRO)  150 MG tablet Take 150 mg by mouth at bedtime.        . isosorbide mononitrate (IMDUR) 60 MG 24 hr tablet Take 1 tablet (60 mg total) by mouth daily.  30 tablet  6  . levothyroxine (SYNTHROID, LEVOTHROID) 100 MCG tablet Take 100 mcg by mouth daily.        Marland Kitchen loratadine (CLARITIN) 10 MG tablet Take 10 mg by mouth daily.        . metoprolol succinate (TOPROL-XL) 25 MG 24 hr tablet Take 1 tablet (25 mg total) by mouth 2 (two) times daily.  60 tablet  6  . morphine (MS CONTIN) 30 MG 12 hr tablet Take 30 mg by mouth 3 (three) times daily.        Marland Kitchen omeprazole (PRILOSEC) 20 MG capsule Take 20 mg by mouth daily.        Marland Kitchen warfarin (COUMADIN) 5 MG tablet Take 5 mg by mouth daily.          Allergies  Allergen Reactions  . Celecoxib   . Penicillins   . Zolpidem Tartrate     History   Social History  . Marital Status: Married    Spouse Name: N/A    Number of Children: N/A  . Years of Education: N/A   Occupational History  . retired    Social History  Main Topics  . Smoking status: Former Smoker -- 3.0 packs/day for 50 years    Types: Cigarettes    Quit date: 01/18/1994  . Smokeless tobacco: Never Used  . Alcohol Use: No  . Drug Use: No  . Sexually Active: Not on file   Other Topics Concern  . Not on file   Social History Narrative  . No narrative on file   Physical Exam: Filed Vitals:   06/25/10 1549  BP: 131/81  Pulse: 76  Height: 6' (1.829 m)  Weight: 232 lb (105.235 kg)    GEN- The patient is elderly and chronically ill appearing, alert and oriented x 3 today.   Head- normocephalic, atraumatic Eyes-  Sclera clear, conjunctiva pink Ears- hearing intact Oropharynx- clear Neck- supple, no JVP Lymph- no cervical lymphadenopathy Lungs- prolonged expiratory phase, normal work of breathing Chest- ICD pocket is well healed Heart- Regular rate and rhythm, no murmurs, rubs or gallops, PMI not laterally displaced GI- soft, NT, ND, + BS Extremities- no clubbing, cyanosis, 1+ edema MS- no significant deformity or atrophy Skin- no rash or lesion Psych- mild to moderate dementia Neuro- strength and sensation are intact  ICD interrogation- reviewed in detail today,  See PACEART report  Assessment and Plan:

## 2010-06-25 NOTE — Assessment & Plan Note (Signed)
No ischemic symptoms Continue medical management and biventricular pacing

## 2010-06-25 NOTE — Assessment & Plan Note (Signed)
Not significantly volume overloaded on exam. No medicine changes today.  His ICD RV 6949 lead has fractured along the coil portion of the lead.  Given the patient's advanced age and comorbidities, the patient, his wife, and I agree that it would be inappropriate to subject him to lead revision.  We have collectively agreed to reprogram his device with VT/VF therapies off.  He continues to biventricularly pace without difficulty.  Given the lead fracture, optivol will no longer be available.

## 2010-06-25 NOTE — Assessment & Plan Note (Signed)
Stable No change required today  

## 2010-07-07 ENCOUNTER — Encounter: Payer: Medicare Other | Admitting: *Deleted

## 2010-07-09 ENCOUNTER — Encounter: Payer: Self-pay | Admitting: *Deleted

## 2010-07-17 ENCOUNTER — Ambulatory Visit (INDEPENDENT_AMBULATORY_CARE_PROVIDER_SITE_OTHER): Payer: Medicare Other | Admitting: *Deleted

## 2010-07-17 DIAGNOSIS — Z7901 Long term (current) use of anticoagulants: Secondary | ICD-10-CM

## 2010-07-17 DIAGNOSIS — I4891 Unspecified atrial fibrillation: Secondary | ICD-10-CM

## 2010-07-17 LAB — POCT INR: INR: 3.1

## 2010-08-14 ENCOUNTER — Ambulatory Visit (INDEPENDENT_AMBULATORY_CARE_PROVIDER_SITE_OTHER): Payer: Medicare Other | Admitting: *Deleted

## 2010-08-14 DIAGNOSIS — Z7901 Long term (current) use of anticoagulants: Secondary | ICD-10-CM

## 2010-08-14 DIAGNOSIS — I4891 Unspecified atrial fibrillation: Secondary | ICD-10-CM

## 2010-08-14 LAB — POCT INR: INR: 3.4

## 2010-09-04 ENCOUNTER — Ambulatory Visit (INDEPENDENT_AMBULATORY_CARE_PROVIDER_SITE_OTHER): Payer: Medicare Other | Admitting: *Deleted

## 2010-09-04 DIAGNOSIS — Z7901 Long term (current) use of anticoagulants: Secondary | ICD-10-CM

## 2010-09-04 DIAGNOSIS — I4891 Unspecified atrial fibrillation: Secondary | ICD-10-CM

## 2010-09-04 LAB — POCT INR: INR: 3

## 2010-09-24 ENCOUNTER — Encounter: Payer: Medicare Other | Admitting: *Deleted

## 2010-10-01 ENCOUNTER — Encounter: Payer: Self-pay | Admitting: *Deleted

## 2010-10-02 ENCOUNTER — Ambulatory Visit (INDEPENDENT_AMBULATORY_CARE_PROVIDER_SITE_OTHER): Payer: Medicare Other | Admitting: *Deleted

## 2010-10-02 DIAGNOSIS — Z7901 Long term (current) use of anticoagulants: Secondary | ICD-10-CM

## 2010-10-02 DIAGNOSIS — I4891 Unspecified atrial fibrillation: Secondary | ICD-10-CM

## 2010-10-05 ENCOUNTER — Ambulatory Visit (INDEPENDENT_AMBULATORY_CARE_PROVIDER_SITE_OTHER): Payer: Medicare Other | Admitting: *Deleted

## 2010-10-05 ENCOUNTER — Other Ambulatory Visit: Payer: Self-pay | Admitting: Internal Medicine

## 2010-10-05 ENCOUNTER — Encounter: Payer: Self-pay | Admitting: Internal Medicine

## 2010-10-05 DIAGNOSIS — I501 Left ventricular failure: Secondary | ICD-10-CM

## 2010-10-05 DIAGNOSIS — I421 Obstructive hypertrophic cardiomyopathy: Secondary | ICD-10-CM

## 2010-10-10 LAB — REMOTE ICD DEVICE
AL AMPLITUDE: 1.5 mv
BATTERY VOLTAGE: 3.11 V
DEV-0020ICD: NEGATIVE
LV LEAD IMPEDENCE ICD: 646 Ohm
RV LEAD AMPLITUDE: 20 mv
RV LEAD IMPEDENCE ICD: 494 Ohm

## 2010-10-16 NOTE — Progress Notes (Signed)
ICD/ICM checked by remote. 

## 2010-10-27 ENCOUNTER — Encounter: Payer: Self-pay | Admitting: *Deleted

## 2010-10-30 ENCOUNTER — Ambulatory Visit (INDEPENDENT_AMBULATORY_CARE_PROVIDER_SITE_OTHER): Payer: Medicare Other | Admitting: *Deleted

## 2010-10-30 DIAGNOSIS — I4891 Unspecified atrial fibrillation: Secondary | ICD-10-CM

## 2010-10-30 DIAGNOSIS — Z7901 Long term (current) use of anticoagulants: Secondary | ICD-10-CM

## 2010-11-27 ENCOUNTER — Ambulatory Visit (INDEPENDENT_AMBULATORY_CARE_PROVIDER_SITE_OTHER): Payer: Medicare Other | Admitting: *Deleted

## 2010-11-27 DIAGNOSIS — I4891 Unspecified atrial fibrillation: Secondary | ICD-10-CM

## 2010-11-27 DIAGNOSIS — Z7901 Long term (current) use of anticoagulants: Secondary | ICD-10-CM

## 2010-12-16 ENCOUNTER — Other Ambulatory Visit: Payer: Self-pay | Admitting: *Deleted

## 2010-12-16 MED ORDER — METOPROLOL SUCCINATE ER 25 MG PO TB24: 25.0000 mg | ORAL_TABLET | Freq: Two times a day (BID) | ORAL | Status: DC

## 2010-12-16 MED ORDER — AMIODARONE HCL 200 MG PO TABS: 100.0000 mg | ORAL_TABLET | Freq: Every day | ORAL | Status: DC

## 2010-12-21 ENCOUNTER — Other Ambulatory Visit: Payer: Self-pay | Admitting: *Deleted

## 2010-12-21 MED ORDER — WARFARIN SODIUM 5 MG PO TABS: 5.0000 mg | ORAL_TABLET | Freq: Every day | ORAL | Status: DC

## 2010-12-25 ENCOUNTER — Ambulatory Visit (INDEPENDENT_AMBULATORY_CARE_PROVIDER_SITE_OTHER): Payer: Medicare Other | Admitting: *Deleted

## 2010-12-25 DIAGNOSIS — I4891 Unspecified atrial fibrillation: Secondary | ICD-10-CM

## 2010-12-25 DIAGNOSIS — Z7901 Long term (current) use of anticoagulants: Secondary | ICD-10-CM

## 2010-12-25 LAB — POCT INR: INR: 2.2

## 2011-01-07 ENCOUNTER — Ambulatory Visit (INDEPENDENT_AMBULATORY_CARE_PROVIDER_SITE_OTHER): Payer: Medicare Other | Admitting: *Deleted

## 2011-01-07 ENCOUNTER — Encounter: Payer: Self-pay | Admitting: Internal Medicine

## 2011-01-07 ENCOUNTER — Other Ambulatory Visit: Payer: Self-pay | Admitting: Internal Medicine

## 2011-01-07 DIAGNOSIS — I5022 Chronic systolic (congestive) heart failure: Secondary | ICD-10-CM

## 2011-01-07 DIAGNOSIS — I2589 Other forms of chronic ischemic heart disease: Secondary | ICD-10-CM

## 2011-01-14 LAB — REMOTE ICD DEVICE: DEV-0020ICD: NEGATIVE

## 2011-01-15 NOTE — Progress Notes (Signed)
ICD remote with ICM 

## 2011-01-20 ENCOUNTER — Encounter: Payer: Self-pay | Admitting: *Deleted

## 2011-02-05 ENCOUNTER — Encounter: Payer: Medicare Other | Admitting: *Deleted

## 2011-02-16 ENCOUNTER — Ambulatory Visit (INDEPENDENT_AMBULATORY_CARE_PROVIDER_SITE_OTHER): Payer: Medicare Other | Admitting: *Deleted

## 2011-02-16 DIAGNOSIS — Z7901 Long term (current) use of anticoagulants: Secondary | ICD-10-CM

## 2011-02-16 DIAGNOSIS — I4891 Unspecified atrial fibrillation: Secondary | ICD-10-CM

## 2011-02-17 ENCOUNTER — Other Ambulatory Visit: Payer: Self-pay | Admitting: *Deleted

## 2011-02-17 MED ORDER — ISOSORBIDE MONONITRATE ER 60 MG PO TB24: 60.0000 mg | ORAL_TABLET | Freq: Every day | ORAL | Status: DC

## 2011-03-19 ENCOUNTER — Other Ambulatory Visit: Payer: Self-pay | Admitting: Cardiology

## 2011-03-30 ENCOUNTER — Ambulatory Visit (INDEPENDENT_AMBULATORY_CARE_PROVIDER_SITE_OTHER): Payer: Medicare Other | Admitting: *Deleted

## 2011-03-30 DIAGNOSIS — Z7901 Long term (current) use of anticoagulants: Secondary | ICD-10-CM

## 2011-03-30 DIAGNOSIS — I4891 Unspecified atrial fibrillation: Secondary | ICD-10-CM

## 2011-03-30 LAB — POCT INR: INR: 2.3

## 2011-04-05 ENCOUNTER — Encounter: Payer: Medicare Other | Admitting: *Deleted

## 2011-04-14 ENCOUNTER — Other Ambulatory Visit: Payer: Self-pay | Admitting: *Deleted

## 2011-04-14 MED ORDER — METOPROLOL SUCCINATE ER 25 MG PO TB24: 25.0000 mg | ORAL_TABLET | Freq: Two times a day (BID) | ORAL | Status: DC

## 2011-04-14 MED ORDER — AMIODARONE HCL 200 MG PO TABS: 100.0000 mg | ORAL_TABLET | Freq: Every day | ORAL | Status: DC

## 2011-04-15 ENCOUNTER — Encounter: Payer: Medicare Other | Admitting: *Deleted

## 2011-04-20 ENCOUNTER — Encounter: Payer: Self-pay | Admitting: *Deleted

## 2011-05-11 ENCOUNTER — Ambulatory Visit (INDEPENDENT_AMBULATORY_CARE_PROVIDER_SITE_OTHER): Payer: Medicare Other | Admitting: *Deleted

## 2011-05-11 DIAGNOSIS — I4891 Unspecified atrial fibrillation: Secondary | ICD-10-CM

## 2011-05-11 DIAGNOSIS — Z7901 Long term (current) use of anticoagulants: Secondary | ICD-10-CM

## 2011-05-11 LAB — POCT INR: INR: 2.4

## 2011-06-09 ENCOUNTER — Ambulatory Visit (INDEPENDENT_AMBULATORY_CARE_PROVIDER_SITE_OTHER): Payer: Medicare Other | Admitting: Internal Medicine

## 2011-06-09 ENCOUNTER — Encounter: Payer: Self-pay | Admitting: Internal Medicine

## 2011-06-09 VITALS — BP 108/55 | HR 66 | Resp 18 | Ht 72.0 in | Wt 227.0 lb

## 2011-06-09 DIAGNOSIS — I4891 Unspecified atrial fibrillation: Secondary | ICD-10-CM

## 2011-06-09 DIAGNOSIS — I5022 Chronic systolic (congestive) heart failure: Secondary | ICD-10-CM

## 2011-06-09 LAB — ICD DEVICE OBSERVATION
AL AMPLITUDE: 1.6 mv
AL IMPEDENCE ICD: 494 Ohm
AL THRESHOLD: 1 V
BATTERY VOLTAGE: 3.0716 V
LV LEAD IMPEDENCE ICD: 646 Ohm
LV LEAD THRESHOLD: 1.625 V
RV LEAD AMPLITUDE: 20 mv
RV LEAD IMPEDENCE ICD: 627 Ohm
TOT-0002: 0
TOT-0006: 20110310000000
TZAT-0001ATACH: 1
TZAT-0001ATACH: 2
TZAT-0001ATACH: 3
TZAT-0001FASTVT: 1
TZAT-0001SLOWVT: 1
TZAT-0001SLOWVT: 2
TZAT-0002ATACH: NEGATIVE
TZAT-0002FASTVT: NEGATIVE
TZAT-0004SLOWVT: 8
TZAT-0005SLOWVT: 88 pct
TZAT-0012ATACH: 150 ms
TZAT-0013SLOWVT: 2
TZAT-0013SLOWVT: 2
TZAT-0018ATACH: NEGATIVE
TZAT-0018ATACH: NEGATIVE
TZAT-0018ATACH: NEGATIVE
TZAT-0018FASTVT: NEGATIVE
TZAT-0018SLOWVT: NEGATIVE
TZAT-0018SLOWVT: NEGATIVE
TZAT-0019ATACH: 6 V
TZAT-0019FASTVT: 8 V
TZAT-0019SLOWVT: 8 V
TZAT-0020SLOWVT: 1.5 ms
TZAT-0020SLOWVT: 1.5 ms
TZON-0003VSLOWVT: 450 ms
TZON-0004SLOWVT: 16
TZON-0004VSLOWVT: 20
TZON-0005SLOWVT: 12
TZST-0001ATACH: 5
TZST-0001ATACH: 6
TZST-0001FASTVT: 4
TZST-0001FASTVT: 5
TZST-0001FASTVT: 6
TZST-0001SLOWVT: 3
TZST-0001SLOWVT: 5
TZST-0001SLOWVT: 6
TZST-0002ATACH: NEGATIVE
TZST-0002ATACH: NEGATIVE
TZST-0002FASTVT: NEGATIVE
TZST-0002FASTVT: NEGATIVE
TZST-0002FASTVT: NEGATIVE
TZST-0003SLOWVT: 35 J
TZST-0003SLOWVT: 35 J
VF: 0

## 2011-06-09 NOTE — Assessment & Plan Note (Signed)
Not significantly volume overloaded on exam. No medicine changes today.  His ICD RV 6949 lead has fractured along the coil portion of the lead.  Given the patient's advanced age and comorbidities, the patient, his wife, and I agree that it would be inappropriate to subject him to lead revision.  We have collectively agreed to reprogram his device with VT/VF therapies off.  He continues to biventricularly pace without difficulty.  Given the lead fracture, optivol will no longer be available.  No changes are required today.

## 2011-06-09 NOTE — Patient Instructions (Signed)
   Continue all current medications. Remote monitoring is used to monitor your Pacemaker of ICD from home. This monitoring reduces the number of office visits required to check your device to one time per year. It allows [place] to keep an eye on the functioning of your device to ensure it is working properly. You are scheduled for a device check from home on 09/16/2011. You may send your transmission at any time that day. If you have a wireless device, the transmission will be sent automatically. After your physician reviews your transmission, you will receive a postcard with your next transmission date.  Follow up Dr. Johney Frame in 1 year.

## 2011-06-09 NOTE — Assessment & Plan Note (Signed)
Stable Continue anticoagulation with coumadin

## 2011-06-09 NOTE — Progress Notes (Signed)
PCP: Josue Hector, MD, MD Primary Cardiologist:  Dr Andee Lineman  The patient presents today for routine electrophysiology followup.  Since last being seen in our clinic, the patient reports doing reasonably well.  He remains limited by COPD and has O2 which he uses at home.  He has not been getting out of the house very much lately.  His wife reports that his dementia is stable.  Today, he denies symptoms of palpitations, chest pain, shortness of breath (above baseline), dizziness, presyncope, syncope, or neurologic sequela.  The patient feels that he is tolerating medications without difficulties and is otherwise without complaint today.   Past Medical History  Diagnosis Date  . Systolic heart failure     chronic (EF was 20%,50% echo 2007)  . ICD (implantable cardiac defibrillator) battery depletion     in situ, RV lead fractured (1324 fidelis lead) and therefore now programmed with VT/VF therapies off  . Cardiomyopathy     ischemic  . Atrial fibrillation   . Mitral regurgitation   . CAD (coronary artery disease)     unspecifide coronary artery disease status post multiple percutaneous coronary intervention in the past last cath in 07/2004: totally occluded RCA with left to right collaterals ,and a 70-75% mid to distal obtuse marginal stenosis medical therapy  . Hypertension   . Hyperlipidemia   . COPD (chronic obstructive pulmonary disease)     has O2 at home  . GERD (gastroesophageal reflux disease)   . Diabetes mellitus   . Sleep apnea   . Anemia   . Dementia    Past Surgical History  Procedure Date  . Insert / replace / remove pacemaker     medtronic insync sentry 7299-09/04/2004  . Cholecystectomy   . Cystoscopy     for bladder cancer years sgo    Current Outpatient Prescriptions  Medication Sig Dispense Refill  . acetaminophen (TYLENOL) 500 MG tablet Take 500 mg by mouth every 12 (twelve) hours.       Marland Kitchen amiodarone (PACERONE) 200 MG tablet Take 0.5 tablets (100 mg  total) by mouth daily.  15 tablet  1  . aspirin 81 MG tablet Take 81 mg by mouth daily.        Marland Kitchen atorvastatin (LIPITOR) 10 MG tablet Take 10 mg by mouth daily.        . B Complex-C-Folic Acid (RENAL MULTIVITAMIN FORMULA PO) Take 1 capsule by mouth daily.        . diazepam (VALIUM) 5 MG tablet Take 5 mg by mouth every 8 (eight) hours as needed.       . digoxin (LANOXIN) 0.125 MG tablet Take 125 mcg by mouth every other day.       . DULoxetine (CYMBALTA) 60 MG capsule Take 60 mg by mouth daily.        . ergocalciferol (VITAMIN D2) 50000 UNITS capsule Take 50,000 Units by mouth once a week.        . fluticasone (FLONASE) 50 MCG/ACT nasal spray 2 sprays by Nasal route daily.        . furosemide (LASIX) 80 MG tablet Take 120 mg by mouth 2 (two) times daily.       Marland Kitchen gabapentin (NEURONTIN) 100 MG capsule Take 100 mg by mouth daily.        . insulin NPH (HUMULIN N) 100 UNIT/ML injection Inject 55 Units into the skin 2 (two) times daily.       . insulin regular (HUMULIN R,NOVOLIN R) 100 UNIT/ML injection Inject  into the skin daily. 7-10 units bid        . irbesartan (AVAPRO) 150 MG tablet Take 150 mg by mouth at bedtime.        . isosorbide mononitrate (IMDUR) 60 MG 24 hr tablet Take 1 tablet (60 mg total) by mouth daily.  30 tablet  6  . levothyroxine (SYNTHROID, LEVOTHROID) 100 MCG tablet Take 100 mcg by mouth daily.        Marland Kitchen loratadine (CLARITIN) 10 MG tablet Take 10 mg by mouth daily.        . metoprolol succinate (TOPROL-XL) 25 MG 24 hr tablet Take 1 tablet (25 mg total) by mouth 2 (two) times daily.  60 tablet  1  . morphine (MS CONTIN) 30 MG 12 hr tablet Take 30 mg by mouth 3 (three) times daily.        . Multiple Vitamins-Minerals (MULTIVITAMIN PO) Take by mouth daily.      Marland Kitchen omeprazole (PRILOSEC) 20 MG capsule Take 20 mg by mouth daily.        Marland Kitchen warfarin (COUMADIN) 5 MG tablet Take 1 tablet (5 mg total) by mouth daily.  45 tablet  2    Allergies  Allergen Reactions  . Celecoxib   .  Penicillins   . Zolpidem Tartrate     History   Social History  . Marital Status: Married    Spouse Name: N/A    Number of Children: N/A  . Years of Education: N/A   Occupational History  . retired    Social History Main Topics  . Smoking status: Former Smoker -- 3.0 packs/day for 50 years    Types: Cigarettes    Quit date: 01/18/1994  . Smokeless tobacco: Never Used  . Alcohol Use: No  . Drug Use: No  . Sexually Active: Not on file   Other Topics Concern  . Not on file   Social History Narrative  . No narrative on file   Physical Exam: Filed Vitals:   06/09/11 1108  BP: 108/55  Pulse: 66  Resp: 18  Height: 6' (1.829 m)  Weight: 227 lb (102.967 kg)    GEN- The patient is elderly and chronically ill appearing, alert and oriented x 3 today.   Head- normocephalic, atraumatic Eyes-  Sclera clear, conjunctiva pink Ears- hearing intact Oropharynx- clear Neck- supple, no JVP Lymph- no cervical lymphadenopathy Lungs- prolonged expiratory phase, normal work of breathing Chest- ICD pocket is well healed Heart- Regular rate and rhythm, no murmurs, rubs or gallops, PMI not laterally displaced GI- soft, NT, ND, + BS Extremities- no clubbing, cyanosis, 1+ edema  ICD interrogation- reviewed in detail today,  See PACEART report  Assessment and Plan:

## 2011-06-17 ENCOUNTER — Other Ambulatory Visit: Payer: Self-pay | Admitting: *Deleted

## 2011-06-17 MED ORDER — AMIODARONE HCL 200 MG PO TABS: 100.0000 mg | ORAL_TABLET | Freq: Every day | ORAL | Status: DC

## 2011-06-17 MED ORDER — METOPROLOL SUCCINATE ER 25 MG PO TB24: 25.0000 mg | ORAL_TABLET | Freq: Two times a day (BID) | ORAL | Status: DC

## 2011-06-22 ENCOUNTER — Ambulatory Visit (INDEPENDENT_AMBULATORY_CARE_PROVIDER_SITE_OTHER): Payer: Medicare Other | Admitting: *Deleted

## 2011-06-22 DIAGNOSIS — I4891 Unspecified atrial fibrillation: Secondary | ICD-10-CM

## 2011-06-22 DIAGNOSIS — Z7901 Long term (current) use of anticoagulants: Secondary | ICD-10-CM

## 2011-06-30 ENCOUNTER — Ambulatory Visit: Payer: Medicare Other | Admitting: Cardiology

## 2011-07-07 ENCOUNTER — Ambulatory Visit (INDEPENDENT_AMBULATORY_CARE_PROVIDER_SITE_OTHER): Payer: Medicare Other | Admitting: Cardiology

## 2011-07-07 ENCOUNTER — Encounter: Payer: Self-pay | Admitting: Cardiology

## 2011-07-07 VITALS — BP 112/54 | HR 61 | Ht 72.0 in | Wt 226.0 lb

## 2011-07-07 DIAGNOSIS — Z7901 Long term (current) use of anticoagulants: Secondary | ICD-10-CM

## 2011-07-07 DIAGNOSIS — Z95810 Presence of automatic (implantable) cardiac defibrillator: Secondary | ICD-10-CM

## 2011-07-07 DIAGNOSIS — I2510 Atherosclerotic heart disease of native coronary artery without angina pectoris: Secondary | ICD-10-CM

## 2011-07-07 DIAGNOSIS — R0602 Shortness of breath: Secondary | ICD-10-CM

## 2011-07-07 DIAGNOSIS — I2589 Other forms of chronic ischemic heart disease: Secondary | ICD-10-CM

## 2011-07-07 DIAGNOSIS — I5023 Acute on chronic systolic (congestive) heart failure: Secondary | ICD-10-CM

## 2011-07-07 DIAGNOSIS — I4891 Unspecified atrial fibrillation: Secondary | ICD-10-CM

## 2011-07-07 DIAGNOSIS — I10 Essential (primary) hypertension: Secondary | ICD-10-CM

## 2011-07-07 NOTE — Patient Instructions (Addendum)
   Echo  Labs:  BMET, BNP, & TSH  Our office will contact with results Follow up in  3 months

## 2011-07-13 ENCOUNTER — Ambulatory Visit (INDEPENDENT_AMBULATORY_CARE_PROVIDER_SITE_OTHER): Payer: Medicare Other | Admitting: *Deleted

## 2011-07-13 DIAGNOSIS — Z7901 Long term (current) use of anticoagulants: Secondary | ICD-10-CM

## 2011-07-13 DIAGNOSIS — I4891 Unspecified atrial fibrillation: Secondary | ICD-10-CM

## 2011-07-13 LAB — POCT INR: INR: 2.2

## 2011-07-15 ENCOUNTER — Telehealth: Payer: Self-pay | Admitting: *Deleted

## 2011-07-15 NOTE — Telephone Encounter (Signed)
Notes Recorded by Lesle Chris, LPN on 1/61/0960 at 10:19 AM Left message to return call.

## 2011-07-15 NOTE — Telephone Encounter (Signed)
Notes Recorded by Lesle Chris, LPN on 06/22/5407 at 11:06 AM Wife [name]) notified of below.

## 2011-07-15 NOTE — Telephone Encounter (Signed)
Message copied by Lesle Chris on Thu Jul 15, 2011 10:19 AM ------      Message from: Lewayne Bunting E      Created: Wed Jul 14, 2011  8:21 AM       Labs stable including BNP is markedly elevated blood sugars. Followup with primary care physician on this issue

## 2011-07-18 NOTE — Progress Notes (Signed)
Peyton Bottoms, MD, [facility] Board Certified in Adult Cardiovascular Medicine,Internal Medicine and Critical Care Medicine    CC:     Shortness of breath.                                                                             HPI:      The patient is an 78 year old male that is of ischemic cardio myopathy, coronary artery disease and a defibrillation amiodarone.  He also has chronic dyspnea in part to underlying lung disease.  He has oxygen at home but doesn't always wear it.  Has occasional chest pressure but no chest pressure on exertion.  Symptoms are not consistent with angina.  He started to shorten the morning according to his wife he doesn't wear his oxygen at that time.  The patient was able to walk from scarred to the parking INTO our office without any difficulty.  He reports no presyncope or syncope.  He has a defibrillator placed and has had no discharges.  He reports no palpitations.  He has no orthopnea PND.  He does report significant back pain.    PMH: reviewed and listed in Problem List in Electronic Records (and see below) Past Medical History  Diagnosis Date  . Systolic heart failure     chronic (EF was 20%,50% echo 2007)  . ICD (implantable cardiac defibrillator) battery depletion     in situ, RV lead fractured (1610 fidelis lead) and therefore now programmed with VT/VF therapies off  . Cardiomyopathy     ischemic  . Atrial fibrillation   . Mitral regurgitation   . CAD (coronary artery disease)     unspecifide coronary artery disease status post multiple percutaneous coronary intervention in the past last cath in 07/2004: totally occluded RCA with left to right collaterals ,and a 70-75% mid to distal obtuse marginal stenosis medical therapy  . Hypertension   . Hyperlipidemia   . COPD (chronic obstructive pulmonary disease)     has O2 at home  . GERD (gastroesophageal reflux disease)   . Diabetes mellitus   . Sleep apnea   . Anemia   . Dementia    Past  Surgical History  Procedure Date  . Insert / replace / remove pacemaker     medtronic insync sentry 7299-09/04/2004  . Cholecystectomy   . Cystoscopy     for bladder cancer years sgo    Allergies/SH/FHX : available in Electronic Records for review  Allergies  Allergen Reactions  . Celecoxib   . Penicillins   . Zolpidem Tartrate    History   Social History  . Marital Status: Married    Spouse Name: N/A    Number of Children: N/A  . Years of Education: N/A   Occupational History  . retired    Social History Main Topics  . Smoking status: Former Smoker -- 3.0 packs/day for 50 years    Types: Cigarettes    Quit date: 01/18/1994  . Smokeless tobacco: Never Used  . Alcohol Use: No  . Drug Use: No  . Sexually Active: Not on file   Other Topics Concern  . Not on file   Social History Narrative  . No  narrative on file   No family history on file.  Medications: Current Outpatient Prescriptions  Medication Sig Dispense Refill  . acetaminophen (TYLENOL) 500 MG tablet Take 500 mg by mouth every 12 (twelve) hours.       Marland Kitchen amiodarone (PACERONE) 200 MG tablet Take 0.5 tablets (100 mg total) by mouth daily.  15 tablet  2  . aspirin 81 MG tablet Take 81 mg by mouth daily.        Marland Kitchen atorvastatin (LIPITOR) 10 MG tablet Take 10 mg by mouth daily.        . B Complex-C-Folic Acid (RENAL MULTIVITAMIN FORMULA PO) Take 1 capsule by mouth daily.        . diazepam (VALIUM) 5 MG tablet Take 5 mg by mouth every 8 (eight) hours as needed.       . digoxin (LANOXIN) 0.125 MG tablet Take 125 mcg by mouth every other day.       . DULoxetine (CYMBALTA) 60 MG capsule Take 60 mg by mouth daily.        . ergocalciferol (VITAMIN D2) 50000 UNITS capsule Take 50,000 Units by mouth once a week.        . fluticasone (FLONASE) 50 MCG/ACT nasal spray 2 sprays by Nasal route daily.        . furosemide (LASIX) 80 MG tablet Take 120 mg by mouth 2 (two) times daily.       Marland Kitchen gabapentin (NEURONTIN) 100 MG  capsule Take 100 mg by mouth daily.        . insulin NPH (HUMULIN N) 100 UNIT/ML injection Inject 55 Units into the skin 2 (two) times daily.       . insulin regular (HUMULIN R,NOVOLIN R) 100 UNIT/ML injection Inject into the skin daily. 7-10 units bid        . irbesartan (AVAPRO) 150 MG tablet Take 150 mg by mouth at bedtime.        . isosorbide mononitrate (IMDUR) 60 MG 24 hr tablet Take 1 tablet (60 mg total) by mouth daily.  30 tablet  6  . levothyroxine (SYNTHROID, LEVOTHROID) 100 MCG tablet Take 100 mcg by mouth daily.        Marland Kitchen loratadine (CLARITIN) 10 MG tablet Take 10 mg by mouth daily.        . metoprolol succinate (TOPROL-XL) 25 MG 24 hr tablet Take 1 tablet (25 mg total) by mouth 2 (two) times daily.  60 tablet  2  . morphine (MS CONTIN) 30 MG 12 hr tablet Take 30 mg by mouth 3 (three) times daily.        . Multiple Vitamins-Minerals (MULTIVITAMIN PO) Take by mouth daily.      Marland Kitchen omeprazole (PRILOSEC) 20 MG capsule Take 20 mg by mouth daily.        Marland Kitchen warfarin (COUMADIN) 5 MG tablet Take 1 tablet (5 mg total) by mouth daily.  45 tablet  2    ROS: No nausea or vomiting. No fever or chills.No melena or hematochezia.No bleeding.No claudication  Physical Exam: BP 112/54  Pulse 61  Ht 6' (1.829 m)  Wt 226 lb (102.513 kg)  BMI 30.65 kg/m2 General:well-nourished male in no distress. Neck:normal carotid upstroke and no carotid bruits.  No thyromegaly.  JVP is 6 cm Lungs:breath sounds bilaterally no wheezing.  Few crackles at the bases Cardiac:regular rate and rhythm with normal S1 and paradoxically was his second heart sound.  No definite pathological murmurs Vascular:no edema.  Normal distal pulses.  Normal  dorsalis pedis and posterior tibial pulses Skin:warm and dry Physcologic:normal affect  12lead [id number] sinus rhythm with ventricular pacing Limited bedside ECHO:N/A No images are attached to the encounter.   I reviewed and summarized the old records. I reviewed ECG and  prior blood work.  Assessment and Plan  Atrial fibrillation Patient remained in normal sinus rhythm by review of electrocardiogram.  He continues on amiodarone.  CARDIOMYOPATHY, ISCHEMIC If further evaluation of echocardiogram.  The patient has increased shortness of breath.  Is unclear if this is related to his underlying lung disease and/or to patient not wearing his oxygen 24 7.  He does have an appointment scheduled with Dr. Orson Aloe.we will await the results of the echocardiogram and BNP and then make a decision if the patient needs to be admitted for IV milrinone and diuresis.  CAD, UNSPECIFIED SITE Significant coronary artery disease with totally occluded RCA with left to right collaterals and 70-75% stenosis of the mid to distal to this marginal branch.  Currently no definitive chest pain.  Encounter for long-term (current) use of anticoagulants Patient continues on warfarin.  He reports no complications in regards to bleeding  ICD - IN SITU No defibrillator discharges noted.saw Dr. Johney Frame may 22nd.see comments above  HYPERTENSION, UNSPECIFIED Blood pressure well controlled.    Patient Active Problem List  Diagnosis  . DIABETES MELLITUS  . HYPERLIPIDEMIA-MIXED  . ANEMIA  . MITRAL REGURGITATION, 0 (MILD)  . HYPERTENSION, UNSPECIFIED  . CAD, UNSPECIFIED SITE  . CARDIOMYOPATHY, ISCHEMIC  . Atrial fibrillation  . COPD  . GERD  . RENAL DISEASE, CHRONIC  . SLEEP APNEA  . ICD - IN SITU  . ERYTHROCYTOSIS  . Encounter for long-term (current) use of anticoagulants

## 2011-07-18 NOTE — Assessment & Plan Note (Signed)
Patient remained in normal sinus rhythm by review of electrocardiogram.  He continues on amiodarone.

## 2011-07-18 NOTE — Assessment & Plan Note (Signed)
Blood pressure well controlled

## 2011-07-18 NOTE — Assessment & Plan Note (Addendum)
If further evaluation of echocardiogram.  The patient has increased shortness of breath.  Is unclear if this is related to his underlying lung disease and/or to patient not wearing his oxygen 24 7.  He does have an appointment scheduled with Dr. Orson Aloe.we will await the results of the echocardiogram and BNP and then make a decision if the patient needs to be admitted for IV milrinone and diuresis.

## 2011-07-18 NOTE — Assessment & Plan Note (Signed)
Patient continues on warfarin.  He reports no complications in regards to bleeding

## 2011-07-18 NOTE — Assessment & Plan Note (Addendum)
No defibrillator discharges noted.saw Dr. Johney Frame may 22nd.see comments above

## 2011-07-18 NOTE — Assessment & Plan Note (Signed)
Significant coronary artery disease with totally occluded RCA with left to right collaterals and 70-75% stenosis of the mid to distal to this marginal branch.  Currently no definitive chest pain.

## 2011-07-21 ENCOUNTER — Other Ambulatory Visit: Payer: Self-pay | Admitting: *Deleted

## 2011-07-21 ENCOUNTER — Other Ambulatory Visit: Payer: Self-pay

## 2011-07-21 ENCOUNTER — Other Ambulatory Visit (INDEPENDENT_AMBULATORY_CARE_PROVIDER_SITE_OTHER): Payer: Medicare Other

## 2011-07-21 DIAGNOSIS — R0602 Shortness of breath: Secondary | ICD-10-CM

## 2011-08-03 ENCOUNTER — Other Ambulatory Visit: Payer: Self-pay | Admitting: *Deleted

## 2011-08-03 MED ORDER — WARFARIN SODIUM 5 MG PO TABS: 5.0000 mg | ORAL_TABLET | Freq: Every day | ORAL | Status: DC

## 2011-09-07 ENCOUNTER — Ambulatory Visit (INDEPENDENT_AMBULATORY_CARE_PROVIDER_SITE_OTHER): Payer: Medicare Other | Admitting: *Deleted

## 2011-09-07 DIAGNOSIS — Z7901 Long term (current) use of anticoagulants: Secondary | ICD-10-CM

## 2011-09-07 DIAGNOSIS — I4891 Unspecified atrial fibrillation: Secondary | ICD-10-CM

## 2011-09-07 LAB — POCT INR: INR: 2.5

## 2011-09-16 ENCOUNTER — Ambulatory Visit (INDEPENDENT_AMBULATORY_CARE_PROVIDER_SITE_OTHER): Payer: Medicare Other | Admitting: *Deleted

## 2011-09-16 ENCOUNTER — Encounter: Payer: Self-pay | Admitting: Internal Medicine

## 2011-09-16 DIAGNOSIS — I2589 Other forms of chronic ischemic heart disease: Secondary | ICD-10-CM

## 2011-09-16 DIAGNOSIS — Z95810 Presence of automatic (implantable) cardiac defibrillator: Secondary | ICD-10-CM

## 2011-09-17 LAB — REMOTE ICD DEVICE
AL AMPLITUDE: 1.9 mv
AL IMPEDENCE ICD: 494 Ohm
ATRIAL PACING ICD: 1.66 pct
BRDY-0004LV: 120 {beats}/min
DEV-0020ICD: NEGATIVE
LV LEAD IMPEDENCE ICD: 646 Ohm
RV LEAD IMPEDENCE ICD: 855 Ohm
TOT-0001: 0
TOT-0002: 0
TOT-0006: 20110310000000
TZAT-0001ATACH: 2
TZAT-0001SLOWVT: 1
TZAT-0001SLOWVT: 2
TZAT-0002ATACH: NEGATIVE
TZAT-0002ATACH: NEGATIVE
TZAT-0002ATACH: NEGATIVE
TZAT-0002FASTVT: NEGATIVE
TZAT-0005SLOWVT: 88 pct
TZAT-0005SLOWVT: 91 pct
TZAT-0011SLOWVT: 10 ms
TZAT-0011SLOWVT: 10 ms
TZAT-0012ATACH: 150 ms
TZAT-0012FASTVT: 200 ms
TZAT-0018ATACH: NEGATIVE
TZAT-0018FASTVT: NEGATIVE
TZAT-0018SLOWVT: NEGATIVE
TZAT-0018SLOWVT: NEGATIVE
TZAT-0019ATACH: 6 V
TZAT-0019ATACH: 6 V
TZAT-0019ATACH: 6 V
TZAT-0019FASTVT: 8 V
TZAT-0019SLOWVT: 8 V
TZAT-0019SLOWVT: 8 V
TZAT-0020ATACH: 1.5 ms
TZAT-0020FASTVT: 1.5 ms
TZON-0003VSLOWVT: 450 ms
TZON-0004VSLOWVT: 20
TZON-0005SLOWVT: 12
TZST-0001ATACH: 5
TZST-0001FASTVT: 3
TZST-0001FASTVT: 5
TZST-0001SLOWVT: 3
TZST-0002ATACH: NEGATIVE
TZST-0002FASTVT: NEGATIVE
TZST-0002FASTVT: NEGATIVE
TZST-0002FASTVT: NEGATIVE
TZST-0003SLOWVT: 25 J
TZST-0003SLOWVT: 35 J
VENTRICULAR PACING ICD: 98.51 pct
VF: 0

## 2011-09-21 ENCOUNTER — Encounter: Payer: Self-pay | Admitting: *Deleted

## 2011-09-21 ENCOUNTER — Other Ambulatory Visit: Payer: Self-pay | Admitting: *Deleted

## 2011-09-21 MED ORDER — METOPROLOL SUCCINATE ER 25 MG PO TB24: 25.0000 mg | ORAL_TABLET | Freq: Two times a day (BID) | ORAL | Status: DC

## 2011-09-21 MED ORDER — AMIODARONE HCL 200 MG PO TABS: 100.0000 mg | ORAL_TABLET | Freq: Every day | ORAL | Status: DC

## 2011-10-08 ENCOUNTER — Ambulatory Visit: Payer: Medicare Other | Admitting: Cardiology

## 2011-10-15 ENCOUNTER — Other Ambulatory Visit: Payer: Self-pay | Admitting: Cardiology

## 2011-10-15 MED ORDER — ISOSORBIDE MONONITRATE ER 60 MG PO TB24: 60.0000 mg | ORAL_TABLET | Freq: Every day | ORAL | Status: DC

## 2011-10-20 ENCOUNTER — Ambulatory Visit: Payer: Medicare Other | Admitting: Cardiology

## 2011-11-10 ENCOUNTER — Encounter: Payer: Self-pay | Admitting: *Deleted

## 2011-11-16 ENCOUNTER — Ambulatory Visit (INDEPENDENT_AMBULATORY_CARE_PROVIDER_SITE_OTHER): Payer: Medicare Other | Admitting: *Deleted

## 2011-11-16 DIAGNOSIS — Z7901 Long term (current) use of anticoagulants: Secondary | ICD-10-CM

## 2011-11-16 DIAGNOSIS — I4891 Unspecified atrial fibrillation: Secondary | ICD-10-CM

## 2011-11-16 LAB — POCT INR: INR: 1.8

## 2011-11-24 ENCOUNTER — Ambulatory Visit (INDEPENDENT_AMBULATORY_CARE_PROVIDER_SITE_OTHER): Payer: Medicare Other | Admitting: Cardiology

## 2011-11-24 ENCOUNTER — Encounter: Payer: Self-pay | Admitting: Cardiology

## 2011-11-24 VITALS — BP 109/63 | HR 64 | Ht 71.0 in | Wt 225.4 lb

## 2011-11-24 DIAGNOSIS — I4891 Unspecified atrial fibrillation: Secondary | ICD-10-CM

## 2011-11-24 DIAGNOSIS — I2510 Atherosclerotic heart disease of native coronary artery without angina pectoris: Secondary | ICD-10-CM

## 2011-11-24 DIAGNOSIS — I10 Essential (primary) hypertension: Secondary | ICD-10-CM

## 2011-11-24 DIAGNOSIS — E785 Hyperlipidemia, unspecified: Secondary | ICD-10-CM

## 2011-11-24 DIAGNOSIS — I2589 Other forms of chronic ischemic heart disease: Secondary | ICD-10-CM

## 2011-11-24 DIAGNOSIS — Z95810 Presence of automatic (implantable) cardiac defibrillator: Secondary | ICD-10-CM

## 2011-11-24 NOTE — Assessment & Plan Note (Signed)
Continues on amiodarone and Coumadin. ECG today shows atrial sensed ventricular paced rhythm.

## 2011-11-24 NOTE — Patient Instructions (Addendum)

## 2011-11-24 NOTE — Assessment & Plan Note (Signed)
Followed by Dr. Johney Frame. Device reprogrammed with VT/VF therapies off in light of RV lead fracture [id number].

## 2011-11-24 NOTE — Assessment & Plan Note (Signed)
Most recent echocardiogram done in July showed improvement in LVEF up to the range of 50-55%.

## 2011-11-24 NOTE — Progress Notes (Signed)
Clinical Summary [name] is a 78 y.o.male presenting for followup. He is a former patient of Dr. Corinda [name], prefers to stay with [name]. He was last seen in June.  Echocardiogram done in July showed LVEF up to the range of 50-55% with inferoseptal hypokinesis, grade 1 diastolic dysfunction, trivial mitral regurgitation, left atrial enlargement of mild degree, device wire visualized in right heart. Lab work in June revealed BUN 21, creatinine 1.4, potassium 4.3, TSH 2.5, BNP 309.  ECG today shows ventricular paced rhythm with atrial sensing.  He is here with his wife today. [name] no change in baseline dyspnea, typically NYHA class 2-3. He [name] no chest pain. He uses a cane to ambulate, intermittently uses oxygen, not on a regular basis.  He states that he is due to have followup lab work with [name] for his next physical.   Allergies  Allergen Reactions  . Celecoxib   . Penicillins   . Zolpidem Tartrate     Current Outpatient Prescriptions  Medication Sig Dispense Refill  . acetaminophen (TYLENOL) 500 MG tablet Take 500 mg by mouth every 12 (twelve) hours.       Marland Kitchen amiodarone (PACERONE) 200 MG tablet Take 0.5 tablets (100 mg total) by mouth daily.  15 tablet  3  . aspirin 81 MG tablet Take 81 mg by mouth daily.        Marland Kitchen atorvastatin (LIPITOR) 10 MG tablet Take 10 mg by mouth daily.        . B Complex-C-Folic Acid (RENAL MULTIVITAMIN FORMULA PO) Take 1 capsule by mouth daily.        . diazepam (VALIUM) 5 MG tablet Take 5 mg by mouth every 8 (eight) hours as needed.       . digoxin (LANOXIN) 0.125 MG tablet Take 125 mcg by mouth every other day.       . DULoxetine (CYMBALTA) 60 MG capsule Take 60 mg by mouth daily.        . ergocalciferol (VITAMIN D2) 50000 UNITS capsule Take 50,000 Units by mouth once a week.        . fluticasone (FLONASE) 50 MCG/ACT nasal spray 2 sprays by Nasal route daily.        . furosemide (LASIX) 80 MG tablet Take 120 mg by mouth 2 (two) times daily.        Marland Kitchen gabapentin (NEURONTIN) 100 MG capsule Take 100 mg by mouth daily.        . insulin NPH (HUMULIN N) 100 UNIT/ML injection Inject 55 Units into the skin 2 (two) times daily.       . insulin regular (HUMULIN R,NOVOLIN R) 100 UNIT/ML injection Inject into the skin daily. 7-10 units bid        . irbesartan (AVAPRO) 150 MG tablet Take 150 mg by mouth at bedtime.        . isosorbide mononitrate (IMDUR) 60 MG 24 hr tablet Take 1 tablet (60 mg total) by mouth daily.  30 tablet  1  . levothyroxine (SYNTHROID, LEVOTHROID) 100 MCG tablet Take 100 mcg by mouth daily.        Marland Kitchen loratadine (CLARITIN) 10 MG tablet Take 10 mg by mouth daily.        . metoprolol succinate (TOPROL-XL) 25 MG 24 hr tablet Take 25 mg by mouth daily.      Marland Kitchen morphine (MS CONTIN) 30 MG 12 hr tablet Take 30 mg by mouth 3 (three) times daily.        Marland Kitchen  omeprazole (PRILOSEC) 20 MG capsule Take 20 mg by mouth daily.        Marland Kitchen warfarin (COUMADIN) 5 MG tablet Take 1 tablet (5 mg total) by mouth daily. (as directed by [name] - coumadin clinic)  45 tablet  3  . [DISCONTINUED] metoprolol succinate (TOPROL-XL) 25 MG 24 hr tablet Take 1 tablet (25 mg total) by mouth 2 (two) times daily.  60 tablet  3    Past Medical History  Diagnosis Date  . Chronic systolic heart failure     LVEF 20% up to 50%  . ICD (implantable cardiac defibrillator) in place     RV lead fractured (2130 fidelis lead) and therefore now programmed with VT/VF therapies off  . Ischemic cardiomyopathy   . Atrial fibrillation   . Mitral regurgitation   . Coronary atherosclerosis of native coronary artery     Multiple percutaneous coronary intervention in the past last cath in 07/2004: totally occluded RCA with left to right collaterals ,and a 70-75% mid to distal obtuse marginal stenosis medical therapy  . Essential hypertension, benign   . Hyperlipidemia   . COPD (chronic obstructive pulmonary disease)     O2 at home  . GERD (gastroesophageal reflux disease)   . Type 2  diabetes mellitus   . Sleep apnea   . Anemia   . Dementia     Past Surgical History  Procedure Date  . Insert / replace / remove pacemaker     Medtronic insync sentry 7299-09/04/2004  . Cholecystectomy   . Cystoscopy     Bladder cancer years sgo    Social History [name] that he quit smoking about 17 years ago. His smoking use included Cigarettes. He has a 150 pack-year smoking history. He has never used smokeless tobacco. [name] that he does not drink alcohol.  Review of Systems No falls or syncope. No palpitations. No hospitalizations since last visit.  Physical Examination Filed Vitals:   11/24/11 1044  BP: 109/63  Pulse: 64   Filed Weights   11/24/11 1044  Weight: 225 lb 6.4 oz (102.241 kg)   No acute distress. HEENT: Conjunctiva and lids normal, oropharynx clear. Neck: Supple, no elevated JVP, no thyromegaly. Lungs: Decreased throughout, nonlabored breathing at rest. Cardiac: Distant, regular rate and rhythm, no S3, no pericardial rub. Abdomen: Soft, nontender, bowel sounds present. Extremities: Trace edema, distal pulses 1-2+. Skin: Warm and dry. Musculoskeletal: No kyphosis. Neuropsychiatric: Alert and oriented x3, affect grossly appropriate.   Problem List and Plan   Coronary atherosclerosis of native coronary artery He [name] no active angina on present medical regimen. Continue observation for now.  CARDIOMYOPATHY, ISCHEMIC Most recent echocardiogram done in July showed improvement in LVEF up to the range of 50-55%.  Atrial fibrillation Continues on amiodarone and Coumadin. ECG today shows atrial sensed ventricular paced rhythm.  ICD - IN SITU Followed by [name]. Device reprogrammed with VT/VF therapies off in light of RV lead fracture [id number].  HYPERLIPIDEMIA-MIXED Followup with [name]. He is on Lipitor. Goal LDL should be close to 70 if possible.  Essential hypertension, benign Blood pressure control is good  today.    [name], M.D., F.A.C.C.

## 2011-11-24 NOTE — Assessment & Plan Note (Signed)
Blood pressure control is good today. 

## 2011-11-24 NOTE — Assessment & Plan Note (Signed)
He reports no active angina on present medical regimen. Continue observation for now.

## 2011-11-24 NOTE — Assessment & Plan Note (Signed)
Followup with Dr. Lysbeth Galas. He is on Lipitor. Goal LDL should be close to 70 if possible.

## 2011-12-07 ENCOUNTER — Ambulatory Visit (INDEPENDENT_AMBULATORY_CARE_PROVIDER_SITE_OTHER): Payer: Medicare Other | Admitting: *Deleted

## 2011-12-07 DIAGNOSIS — Z7901 Long term (current) use of anticoagulants: Secondary | ICD-10-CM

## 2011-12-07 DIAGNOSIS — I4891 Unspecified atrial fibrillation: Secondary | ICD-10-CM

## 2011-12-07 LAB — POCT INR: INR: 2.2

## 2011-12-20 ENCOUNTER — Ambulatory Visit (INDEPENDENT_AMBULATORY_CARE_PROVIDER_SITE_OTHER): Payer: Medicare Other | Admitting: *Deleted

## 2011-12-20 ENCOUNTER — Encounter: Payer: Self-pay | Admitting: Internal Medicine

## 2011-12-20 DIAGNOSIS — Z95810 Presence of automatic (implantable) cardiac defibrillator: Secondary | ICD-10-CM

## 2011-12-20 DIAGNOSIS — I2589 Other forms of chronic ischemic heart disease: Secondary | ICD-10-CM

## 2011-12-27 LAB — REMOTE ICD DEVICE
AL AMPLITUDE: 2 mv
AL IMPEDENCE ICD: 551 Ohm
ATRIAL PACING ICD: 2.47 pct
BAMS-0001: 170 {beats}/min
BRDY-0004LV: 120 {beats}/min
DEV-0020ICD: NEGATIVE
LV LEAD IMPEDENCE ICD: 646 Ohm
RV LEAD IMPEDENCE ICD: 1197 Ohm
TOT-0002: 0
TOT-0006: 20110310000000
TZAT-0001ATACH: 2
TZAT-0002ATACH: NEGATIVE
TZAT-0004SLOWVT: 8
TZAT-0004SLOWVT: 8
TZAT-0011SLOWVT: 10 ms
TZAT-0011SLOWVT: 10 ms
TZAT-0012ATACH: 150 ms
TZAT-0012FASTVT: 200 ms
TZAT-0012SLOWVT: 200 ms
TZAT-0012SLOWVT: 200 ms
TZAT-0018ATACH: NEGATIVE
TZAT-0018FASTVT: NEGATIVE
TZAT-0019ATACH: 6 V
TZAT-0019ATACH: 6 V
TZAT-0019FASTVT: 8 V
TZAT-0019SLOWVT: 8 V
TZAT-0019SLOWVT: 8 V
TZAT-0020ATACH: 1.5 ms
TZAT-0020ATACH: 1.5 ms
TZAT-0020ATACH: 1.5 ms
TZAT-0020FASTVT: 1.5 ms
TZAT-0020SLOWVT: 1.5 ms
TZAT-0020SLOWVT: 1.5 ms
TZON-0003ATACH: 350 ms
TZON-0003SLOWVT: 340 ms
TZON-0003VSLOWVT: 450 ms
TZON-0004VSLOWVT: 20
TZST-0001FASTVT: 2
TZST-0001FASTVT: 5
TZST-0001FASTVT: 6
TZST-0001SLOWVT: 5
TZST-0002ATACH: NEGATIVE
TZST-0002FASTVT: NEGATIVE
TZST-0002FASTVT: NEGATIVE
TZST-0003SLOWVT: 25 J
TZST-0003SLOWVT: 35 J
VENTRICULAR PACING ICD: 98.57 pct
VF: 0

## 2011-12-29 ENCOUNTER — Encounter: Payer: Self-pay | Admitting: *Deleted

## 2012-01-04 ENCOUNTER — Ambulatory Visit (INDEPENDENT_AMBULATORY_CARE_PROVIDER_SITE_OTHER): Payer: Medicare Other | Admitting: *Deleted

## 2012-01-04 DIAGNOSIS — Z7901 Long term (current) use of anticoagulants: Secondary | ICD-10-CM

## 2012-01-04 DIAGNOSIS — I4891 Unspecified atrial fibrillation: Secondary | ICD-10-CM

## 2012-01-04 LAB — POCT INR: INR: 2.2

## 2012-01-10 ENCOUNTER — Other Ambulatory Visit: Payer: Self-pay | Admitting: Cardiology

## 2012-01-10 MED ORDER — ISOSORBIDE MONONITRATE ER 60 MG PO TB24: 60.0000 mg | ORAL_TABLET | Freq: Every day | ORAL | Status: DC

## 2012-02-14 ENCOUNTER — Other Ambulatory Visit: Payer: Self-pay | Admitting: Cardiology

## 2012-02-14 MED ORDER — METOPROLOL SUCCINATE ER 25 MG PO TB24: 25.0000 mg | ORAL_TABLET | Freq: Every day | ORAL | Status: DC

## 2012-02-14 MED ORDER — AMIODARONE HCL 200 MG PO TABS: 100.0000 mg | ORAL_TABLET | Freq: Every day | ORAL | Status: DC

## 2012-02-14 MED ORDER — ISOSORBIDE MONONITRATE ER 60 MG PO TB24: 60.0000 mg | ORAL_TABLET | Freq: Every day | ORAL | Status: DC

## 2012-03-17 ENCOUNTER — Other Ambulatory Visit (HOSPITAL_COMMUNITY): Payer: Medicare Other

## 2012-03-17 ENCOUNTER — Encounter (HOSPITAL_COMMUNITY): Payer: Medicare Other | Attending: Family Medicine

## 2012-03-17 ENCOUNTER — Other Ambulatory Visit: Payer: Self-pay | Admitting: Family Medicine

## 2012-03-17 DIAGNOSIS — D649 Anemia, unspecified: Secondary | ICD-10-CM | POA: Insufficient documentation

## 2012-03-17 LAB — HEMOGLOBIN AND HEMATOCRIT, BLOOD
HCT: 60.8 % — ABNORMAL HIGH (ref 39.0–52.0)
Hemoglobin: 20.6 g/dL — ABNORMAL HIGH (ref 13.0–17.0)

## 2012-03-17 NOTE — Progress Notes (Signed)
Labs drawn today for type and cross 

## 2012-03-20 ENCOUNTER — Encounter (HOSPITAL_COMMUNITY): Payer: Medicare Other

## 2012-03-22 ENCOUNTER — Encounter: Payer: Self-pay | Admitting: Internal Medicine

## 2012-03-22 ENCOUNTER — Other Ambulatory Visit: Payer: Self-pay | Admitting: Internal Medicine

## 2012-03-27 ENCOUNTER — Ambulatory Visit (INDEPENDENT_AMBULATORY_CARE_PROVIDER_SITE_OTHER): Payer: Medicare Other | Admitting: *Deleted

## 2012-03-27 DIAGNOSIS — I2589 Other forms of chronic ischemic heart disease: Secondary | ICD-10-CM

## 2012-03-27 DIAGNOSIS — Z95810 Presence of automatic (implantable) cardiac defibrillator: Secondary | ICD-10-CM

## 2012-04-09 LAB — REMOTE ICD DEVICE
AL IMPEDENCE ICD: 551 Ohm
ATRIAL PACING ICD: 0.82 pct
BATTERY VOLTAGE: 3.0102 V
BRDY-0003LV: 130 {beats}/min
BRDY-0004LV: 120 {beats}/min
CHARGE TIME: 9.429 s
LV LEAD IMPEDENCE ICD: 627 Ohm
LV LEAD THRESHOLD: 1.5 V
PACEART VT: 0
TOT-0001: 0
TOT-0002: 0
TZAT-0001FASTVT: 1
TZAT-0002ATACH: NEGATIVE
TZAT-0002ATACH: NEGATIVE
TZAT-0004SLOWVT: 8
TZAT-0004SLOWVT: 8
TZAT-0005SLOWVT: 88 pct
TZAT-0005SLOWVT: 91 pct
TZAT-0011SLOWVT: 10 ms
TZAT-0011SLOWVT: 10 ms
TZAT-0012ATACH: 150 ms
TZAT-0012ATACH: 150 ms
TZAT-0012FASTVT: 200 ms
TZAT-0012SLOWVT: 200 ms
TZAT-0012SLOWVT: 200 ms
TZAT-0019ATACH: 6 V
TZAT-0019ATACH: 6 V
TZAT-0019FASTVT: 8 V
TZAT-0019SLOWVT: 8 V
TZAT-0020ATACH: 1.5 ms
TZAT-0020ATACH: 1.5 ms
TZAT-0020FASTVT: 1.5 ms
TZON-0003ATACH: 350 ms
TZON-0003SLOWVT: 340 ms
TZON-0003VSLOWVT: 450 ms
TZST-0001ATACH: 4
TZST-0001FASTVT: 2
TZST-0001FASTVT: 5
TZST-0001SLOWVT: 4
TZST-0002FASTVT: NEGATIVE
TZST-0002FASTVT: NEGATIVE
TZST-0002FASTVT: NEGATIVE
TZST-0003SLOWVT: 25 J
TZST-0003SLOWVT: 35 J
TZST-0003SLOWVT: 35 J
VENTRICULAR PACING ICD: 98.38 pct

## 2012-04-25 ENCOUNTER — Ambulatory Visit (INDEPENDENT_AMBULATORY_CARE_PROVIDER_SITE_OTHER): Payer: Medicare Other | Admitting: *Deleted

## 2012-04-25 DIAGNOSIS — I4891 Unspecified atrial fibrillation: Secondary | ICD-10-CM

## 2012-04-25 DIAGNOSIS — Z7901 Long term (current) use of anticoagulants: Secondary | ICD-10-CM

## 2012-04-25 LAB — POCT INR: INR: 2.3

## 2012-05-02 ENCOUNTER — Encounter: Payer: Self-pay | Admitting: *Deleted

## 2012-05-09 ENCOUNTER — Other Ambulatory Visit: Payer: Self-pay | Admitting: Cardiology

## 2012-05-09 MED ORDER — WARFARIN SODIUM 5 MG PO TABS: 5.0000 mg | ORAL_TABLET | Freq: Every day | ORAL | Status: DC

## 2012-05-25 ENCOUNTER — Ambulatory Visit: Payer: Medicare Other | Admitting: Cardiology

## 2012-06-02 ENCOUNTER — Ambulatory Visit (INDEPENDENT_AMBULATORY_CARE_PROVIDER_SITE_OTHER): Payer: Medicare Other | Admitting: Cardiology

## 2012-06-02 ENCOUNTER — Encounter: Payer: Self-pay | Admitting: Cardiology

## 2012-06-02 VITALS — BP 128/60 | HR 74 | Ht 72.0 in | Wt 227.5 lb

## 2012-06-02 DIAGNOSIS — I4891 Unspecified atrial fibrillation: Secondary | ICD-10-CM

## 2012-06-02 DIAGNOSIS — Z95810 Presence of automatic (implantable) cardiac defibrillator: Secondary | ICD-10-CM

## 2012-06-02 DIAGNOSIS — I2589 Other forms of chronic ischemic heart disease: Secondary | ICD-10-CM

## 2012-06-02 DIAGNOSIS — I2510 Atherosclerotic heart disease of native coronary artery without angina pectoris: Secondary | ICD-10-CM

## 2012-06-02 NOTE — Assessment & Plan Note (Signed)
Paroxysmal, sinus rhythm with atrial pacing noted. Continue current regimen including Coumadin.

## 2012-06-02 NOTE — Patient Instructions (Addendum)
Your physician wants you to follow-up in: 6 months with Dr. McDowell. You will receive a reminder letter in the mail two months in advance. If you don't receive a letter, please call our office to schedule the follow-up appointment.  

## 2012-06-02 NOTE — Assessment & Plan Note (Signed)
Biventricular ICD with VT/VF therapies turned off due to RV lead fracture.

## 2012-06-02 NOTE — Assessment & Plan Note (Signed)
Most recent LVEF up to 50%.

## 2012-06-02 NOTE — Assessment & Plan Note (Signed)
No active angina symptoms. Continue observation. 

## 2012-06-02 NOTE — Progress Notes (Signed)
Clinical Summary [name] is an 79 y.o.male last seen in November 2013. He is here with his wife. No major change in his functional capacity which is limited at baseline. He denies any falls using his cane, no bleeding problems.  Echocardiogram done in July 2013 showed LVEF up to the range of 50-55% with inferoseptal hypokinesis, grade 1 diastolic dysfunction, trivial mitral regurgitation, left atrial enlargement of mild degree, device wire visualized in right heart.   He continues to follow with [name].  ECG shows sinus rhythm with ventricular pacing.  Allergies  Allergen Reactions  . Celecoxib   . Penicillins   . Zolpidem Tartrate     Current Outpatient Prescriptions  Medication Sig Dispense Refill  . acetaminophen (TYLENOL) 500 MG tablet Take 500 mg by mouth every 12 (twelve) hours.       Marland Kitchen amiodarone (PACERONE) 200 MG tablet Take 0.5 tablets (100 mg total) by mouth daily.  15 tablet  6  . aspirin 81 MG tablet Take 81 mg by mouth daily.        Marland Kitchen atorvastatin (LIPITOR) 10 MG tablet Take 10 mg by mouth daily.        . B Complex-C-Folic Acid (RENAL MULTIVITAMIN FORMULA PO) Take 1 capsule by mouth daily.        . diazepam (VALIUM) 5 MG tablet Take 5 mg by mouth every 8 (eight) hours as needed.       . digoxin (LANOXIN) 0.125 MG tablet Take 125 mcg by mouth every other day.       . DULoxetine (CYMBALTA) 60 MG capsule Take 60 mg by mouth daily.        . ergocalciferol (VITAMIN D2) 50000 UNITS capsule Take 50,000 Units by mouth once a week.        . fluticasone (FLONASE) 50 MCG/ACT nasal spray 2 sprays by Nasal route daily.        . furosemide (LASIX) 80 MG tablet Take 120 mg by mouth 2 (two) times daily.       Marland Kitchen gabapentin (NEURONTIN) 100 MG capsule Take 100 mg by mouth daily.        . insulin NPH (HUMULIN N) 100 UNIT/ML injection Inject 55 Units into the skin 2 (two) times daily.       . insulin regular (HUMULIN R,NOVOLIN R) 100 UNIT/ML injection Inject into the skin daily.  7-10 units bid        . irbesartan (AVAPRO) 150 MG tablet Take 150 mg by mouth at bedtime.        . isosorbide mononitrate (IMDUR) 60 MG 24 hr tablet Take 1 tablet (60 mg total) by mouth daily.  30 tablet  6  . levothyroxine (SYNTHROID, LEVOTHROID) 100 MCG tablet Take 100 mcg by mouth daily.        . metoprolol succinate (TOPROL-XL) 25 MG 24 hr tablet Take 25 mg by mouth 2 (two) times daily.      Marland Kitchen morphine (MS CONTIN) 30 MG 12 hr tablet Take 30 mg by mouth 3 (three) times daily.        Marland Kitchen omeprazole (PRILOSEC) 20 MG capsule Take 20 mg by mouth daily.        Marland Kitchen loratadine (CLARITIN) 10 MG tablet Take 10 mg by mouth daily.        Marland Kitchen warfarin (COUMADIN) 5 MG tablet Take 1 tablet (5 mg total) by mouth daily. (as directed by [facility] - coumadin clinic)  45 tablet  3   No current facility-administered  medications for this visit.    Past Medical History  Diagnosis Date  . Chronic systolic heart failure     LVEF 20% up to 50%  . ICD (implantable cardiac defibrillator) in place     RV lead fractured (1610 fidelis lead) and therefore now programmed with VT/VF therapies off  . Ischemic cardiomyopathy   . Atrial fibrillation   . Mitral regurgitation   . Coronary atherosclerosis of native coronary artery     Multiple percutaneous coronary intervention in the past last cath in 07/2004: totally occluded RCA with left to right collaterals ,and a 70-75% mid to distal obtuse marginal stenosis medical therapy  . Essential hypertension, benign   . Hyperlipidemia   . COPD (chronic obstructive pulmonary disease)     O2 at home  . GERD (gastroesophageal reflux disease)   . Type 2 diabetes mellitus   . Sleep apnea   . Anemia   . Dementia     Past Surgical History  Procedure Laterality Date  . Insert / replace / remove pacemaker      Medtronic insync sentry 7299-09/04/2004  . Cholecystectomy    . Cystoscopy      Bladder cancer years sgo    Social History [name] reports that he quit smoking about 18  years ago. His smoking use included Cigarettes. He has a 150 pack-year smoking history. He has never used smokeless tobacco. [name] reports that he does not drink alcohol.  Review of Systems Stable appetite. No orthopnea.  Physical Examination Filed Vitals:   06/02/12 1551  BP: 128/60  Pulse: 74   Filed Weights   06/02/12 1551  Weight: 227 lb 8 oz (103.193 kg)    No acute distress.  HEENT: Conjunctiva and lids normal, oropharynx clear.  Neck: Supple, no elevated JVP, no thyromegaly.  Lungs: Decreased throughout, nonlabored breathing at rest.  Cardiac: Distant, regular rate and rhythm, no S3, no pericardial rub.  Abdomen: Soft, nontender, bowel sounds present.  Extremities: Trace edema, distal pulses 1-2+.  Skin: Warm and dry.  Musculoskeletal: No kyphosis.  Neuropsychiatric: Alert and oriented x3, affect grossly appropriate.   Problem List and Plan   Atrial fibrillation Paroxysmal, sinus rhythm with atrial pacing noted. Continue current regimen including Coumadin.  Coronary atherosclerosis of native coronary artery No active angina symptoms. Continue observation.  CARDIOMYOPATHY, ISCHEMIC Most recent LVEF up to 50%.  ICD - IN SITU Biventricular ICD with VT/VF therapies turned off due to RV lead fracture.    Jonelle Sidle, M.D., F.A.C.C.

## 2012-06-08 ENCOUNTER — Encounter: Payer: Medicare Other | Admitting: Internal Medicine

## 2012-06-09 ENCOUNTER — Other Ambulatory Visit: Payer: Self-pay | Admitting: Cardiology

## 2012-08-02 ENCOUNTER — Encounter: Payer: Medicare Other | Admitting: Internal Medicine

## 2012-09-11 ENCOUNTER — Encounter: Payer: Self-pay | Admitting: Internal Medicine

## 2012-09-11 ENCOUNTER — Ambulatory Visit (INDEPENDENT_AMBULATORY_CARE_PROVIDER_SITE_OTHER): Payer: Medicare Other | Admitting: Internal Medicine

## 2012-09-11 VITALS — BP 114/59 | HR 75 | Ht 72.0 in | Wt 223.0 lb

## 2012-09-11 DIAGNOSIS — I519 Heart disease, unspecified: Secondary | ICD-10-CM

## 2012-09-11 DIAGNOSIS — I2589 Other forms of chronic ischemic heart disease: Secondary | ICD-10-CM

## 2012-09-11 DIAGNOSIS — I4891 Unspecified atrial fibrillation: Secondary | ICD-10-CM

## 2012-09-11 DIAGNOSIS — Z7901 Long term (current) use of anticoagulants: Secondary | ICD-10-CM

## 2012-09-11 DIAGNOSIS — Z95810 Presence of automatic (implantable) cardiac defibrillator: Secondary | ICD-10-CM

## 2012-09-11 LAB — ICD DEVICE OBSERVATION
AL AMPLITUDE: 2.8 mv
AL IMPEDENCE ICD: 589 Ohm
AL THRESHOLD: 1 V
BATTERY VOLTAGE: 2.9761 V
LV LEAD IMPEDENCE ICD: 684 Ohm
LV LEAD THRESHOLD: 1.875 V
RV LEAD IMPEDENCE ICD: 1634 Ohm
TOT-0002: 0
TOT-0006: 20110310000000
TZAT-0001ATACH: 1
TZAT-0001ATACH: 2
TZAT-0001ATACH: 3
TZAT-0001FASTVT: 1
TZAT-0001SLOWVT: 1
TZAT-0001SLOWVT: 2
TZAT-0002ATACH: NEGATIVE
TZAT-0002ATACH: NEGATIVE
TZAT-0002FASTVT: NEGATIVE
TZAT-0012ATACH: 150 ms
TZAT-0013SLOWVT: 2
TZAT-0013SLOWVT: 2
TZAT-0018ATACH: NEGATIVE
TZAT-0018ATACH: NEGATIVE
TZAT-0018FASTVT: NEGATIVE
TZAT-0018SLOWVT: NEGATIVE
TZAT-0018SLOWVT: NEGATIVE
TZAT-0019ATACH: 6 V
TZAT-0019ATACH: 6 V
TZAT-0019FASTVT: 8 V
TZAT-0019SLOWVT: 8 V
TZAT-0019SLOWVT: 8 V
TZAT-0020SLOWVT: 1.5 ms
TZAT-0020SLOWVT: 1.5 ms
TZON-0004SLOWVT: 16
TZON-0004VSLOWVT: 20
TZON-0005SLOWVT: 12
TZST-0001ATACH: 5
TZST-0001ATACH: 6
TZST-0001FASTVT: 3
TZST-0001FASTVT: 4
TZST-0001FASTVT: 5
TZST-0001SLOWVT: 3
TZST-0001SLOWVT: 6
TZST-0002ATACH: NEGATIVE
TZST-0002ATACH: NEGATIVE
TZST-0002FASTVT: NEGATIVE
TZST-0002FASTVT: NEGATIVE
TZST-0002FASTVT: NEGATIVE
TZST-0003SLOWVT: 35 J
TZST-0003SLOWVT: 35 J
VF: 0

## 2012-09-11 NOTE — Progress Notes (Signed)
PCP: Josue Hector, MD Primary Cardiologist:  Dr Diona Browner  The patient presents today for routine electrophysiology followup.  Since last being seen in our clinic, the patient reports doing reasonably well.  He remains limited by COPD.   His wife reports that his dementia is stable.  Today, he denies symptoms of palpitations, chest pain, shortness of breath (above baseline), dizziness, presyncope, syncope, or neurologic sequela.  The patient feels that he is tolerating medications without difficulties and is otherwise without complaint today.   Past Medical History  Diagnosis Date  . Chronic systolic heart failure     LVEF 20% up to 50%  . ICD (implantable cardiac defibrillator) in place     RV lead fractured (1610 fidelis lead) and therefore now programmed with VT/VF therapies off  . Ischemic cardiomyopathy   . Atrial fibrillation   . Mitral regurgitation   . Coronary atherosclerosis of native coronary artery     Multiple percutaneous coronary intervention in the past last cath in 07/2004: totally occluded RCA with left to right collaterals ,and a 70-75% mid to distal obtuse marginal stenosis medical therapy  . Essential hypertension, benign   . Hyperlipidemia   . COPD (chronic obstructive pulmonary disease)     O2 at home  . GERD (gastroesophageal reflux disease)   . Type 2 diabetes mellitus   . Sleep apnea   . Anemia   . Dementia    Past Surgical History  Procedure Laterality Date  . Insert / replace / remove pacemaker      Medtronic insync sentry 7299-09/04/2004  . Cholecystectomy    . Cystoscopy      Bladder cancer years sgo    Current Outpatient Prescriptions  Medication Sig Dispense Refill  . acetaminophen (TYLENOL) 500 MG tablet Take 500 mg by mouth every 12 (twelve) hours.       Marland Kitchen amiodarone (PACERONE) 200 MG tablet Take 0.5 tablets (100 mg total) by mouth daily.  15 tablet  6  . aspirin 81 MG tablet Take 81 mg by mouth daily.        Marland Kitchen atorvastatin (LIPITOR) 10  MG tablet Take 10 mg by mouth daily.        . B Complex-C-Folic Acid (RENAL MULTIVITAMIN FORMULA PO) Take 1 capsule by mouth daily.        . diazepam (VALIUM) 5 MG tablet Take 5 mg by mouth every 8 (eight) hours as needed.       . digoxin (LANOXIN) 0.125 MG tablet Take 125 mcg by mouth every other day.       . DULoxetine (CYMBALTA) 60 MG capsule Take 60 mg by mouth daily.        . ergocalciferol (VITAMIN D2) 50000 UNITS capsule Take 50,000 Units by mouth once a week.        . fluticasone (FLONASE) 50 MCG/ACT nasal spray 2 sprays by Nasal route daily.        . furosemide (LASIX) 80 MG tablet Take 120 mg by mouth 2 (two) times daily.       Marland Kitchen gabapentin (NEURONTIN) 100 MG capsule Take 100 mg by mouth daily.        . insulin NPH (HUMULIN N) 100 UNIT/ML injection Inject 55 Units into the skin 2 (two) times daily.       . insulin regular (HUMULIN R,NOVOLIN R) 100 UNIT/ML injection Inject into the skin daily. 7-10 units bid        . irbesartan (AVAPRO) 150 MG tablet Take 150 mg by  mouth at bedtime.        . isosorbide mononitrate (IMDUR) 60 MG 24 hr tablet Take 1 tablet (60 mg total) by mouth daily.  30 tablet  6  . levothyroxine (SYNTHROID, LEVOTHROID) 100 MCG tablet Take 100 mcg by mouth daily.        Marland Kitchen loratadine (CLARITIN) 10 MG tablet Take 10 mg by mouth daily.        . metoprolol succinate (TOPROL-XL) 25 MG 24 hr tablet TAKE 1 TABLET BY MOUTH TWICE DAILY  60 tablet  6  . morphine (MS CONTIN) 30 MG 12 hr tablet Take 30 mg by mouth 3 (three) times daily.        Marland Kitchen omeprazole (PRILOSEC) 20 MG capsule Take 20 mg by mouth daily.        . Rivaroxaban (XARELTO) 15 MG TABS tablet Take 15 mg by mouth daily.       No current facility-administered medications for this visit.    Allergies  Allergen Reactions  . Celecoxib   . Penicillins   . Zolpidem Tartrate     History   Social History  . Marital Status: Married    Spouse Name: N/A    Number of Children: N/A  . Years of Education: N/A    Occupational History  . retired    Social History Main Topics  . Smoking status: Former Smoker -- 3.00 packs/day for 50 years    Types: Cigarettes    Quit date: 01/18/1994  . Smokeless tobacco: Never Used  . Alcohol Use: No  . Drug Use: No  . Sexual Activity: Not on file   Other Topics Concern  . Not on file   Social History Narrative  . No narrative on file   Physical Exam: Filed Vitals:   09/11/12 1505  BP: 114/59  Pulse: 75  Height: 6' (1.829 m)  Weight: 223 lb (101.152 kg)    GEN- The patient is elderly and chronically ill appearing, alert and oriented x 3 today.   Head- normocephalic, atraumatic Eyes-  Sclera clear, conjunctiva pink Ears- hearing intact Oropharynx- clear Neck- supple, no JVP Lymph- no cervical lymphadenopathy Lungs- prolonged expiratory phase, normal work of breathing Chest- ICD pocket is well healed Heart- Regular rate and rhythm, no murmurs, rubs or gallops, PMI not laterally displaced GI- soft, NT, ND, + BS Extremities- no clubbing, cyanosis, 1+ edema  ICD interrogation- reviewed in detail today,  See PACEART report  Assessment and Plan:  1. Chronic systolic dysfunction/ LBBB EF has recovered with CRT His Fidelis 6949 lead has previously fractured along the coil portion of the lead.  After long discussion with pt and spouse, we decided to turn tachy therapies off and to not revise the system.  He now has elevated trends in RV pace/sense impedance.  Though sensing/ threshold are normal, I suspect that with time, this portion of the lead may also fail.  He is not dependant and has a strong underlying rhythm.  I think that if/when the lead fails, we will LV only pace the patient.  This may require downgrade to a PPM.  Lead revision at that time would also be an option to consider. We will follow closely with carelink.   Given lead fracture, optivol is no longer available  2. Ischemic CM Stable No change required today  3.  afib Stable No change required today On xarelto for stroke prevention  carelink Return to the device clinic in 1 year

## 2012-09-11 NOTE — Patient Instructions (Signed)
Continue all current medications. Your physician wants you to follow up in:  1 year.  You will receive a reminder letter in the mail one-two months in advance.  If you don't receive a letter, please call our office to schedule the follow up appointment   

## 2012-09-29 ENCOUNTER — Ambulatory Visit: Payer: Self-pay | Admitting: *Deleted

## 2012-09-29 DIAGNOSIS — I4891 Unspecified atrial fibrillation: Secondary | ICD-10-CM

## 2012-09-29 DIAGNOSIS — Z7901 Long term (current) use of anticoagulants: Secondary | ICD-10-CM

## 2012-12-18 ENCOUNTER — Ambulatory Visit (INDEPENDENT_AMBULATORY_CARE_PROVIDER_SITE_OTHER): Payer: Medicare Other | Admitting: *Deleted

## 2012-12-18 ENCOUNTER — Encounter: Payer: Self-pay | Admitting: Internal Medicine

## 2012-12-18 DIAGNOSIS — I4891 Unspecified atrial fibrillation: Secondary | ICD-10-CM

## 2012-12-18 LAB — MDC_IDC_ENUM_SESS_TYPE_REMOTE
Brady Statistic AP VP Percent: 0.51 %
Brady Statistic AP VS Percent: 0.02 %
Brady Statistic AS VP Percent: 97.67 %
Brady Statistic AS VS Percent: 1.81 %
Brady Statistic RV Percent Paced: 98.17 %
Date Time Interrogation Session: 20141201152938
HighPow Impedance: 85 Ohm
Lead Channel Impedance Value: 1786 Ohm
Lead Channel Impedance Value: 323 Ohm
Lead Channel Impedance Value: 532 Ohm
Lead Channel Impedance Value: 551 Ohm
Lead Channel Impedance Value: 646 Ohm
Lead Channel Pacing Threshold Amplitude: 1.875 V
Lead Channel Pacing Threshold Pulse Width: 0.4 ms
Lead Channel Sensing Intrinsic Amplitude: 25.375 mV
Lead Channel Setting Pacing Amplitude: 2.5 V
Lead Channel Setting Pacing Amplitude: 3 V
Lead Channel Setting Pacing Pulse Width: 0.6 ms
Zone Setting Detection Interval: 350 ms
Zone Setting Detection Interval: 450 ms

## 2013-01-19 ENCOUNTER — Encounter: Payer: Self-pay | Admitting: *Deleted

## 2013-02-16 ENCOUNTER — Ambulatory Visit (INDEPENDENT_AMBULATORY_CARE_PROVIDER_SITE_OTHER): Payer: Medicare Other | Admitting: Cardiology

## 2013-02-16 ENCOUNTER — Encounter: Payer: Self-pay | Admitting: Cardiology

## 2013-02-16 VITALS — BP 130/71 | HR 80 | Ht 72.0 in | Wt 227.0 lb

## 2013-02-16 DIAGNOSIS — I4891 Unspecified atrial fibrillation: Secondary | ICD-10-CM

## 2013-02-16 DIAGNOSIS — I2510 Atherosclerotic heart disease of native coronary artery without angina pectoris: Secondary | ICD-10-CM

## 2013-02-16 DIAGNOSIS — Z95810 Presence of automatic (implantable) cardiac defibrillator: Secondary | ICD-10-CM

## 2013-02-16 DIAGNOSIS — I2589 Other forms of chronic ischemic heart disease: Secondary | ICD-10-CM

## 2013-02-16 NOTE — Assessment & Plan Note (Signed)
Paroxysmal, symptomatically well controlled on current regimen which includes amiodarone and Xarelto. WE have asked him to stop aspirin.

## 2013-02-16 NOTE — Progress Notes (Signed)
Clinical Summary [name] is an 80 y.o.male last seen in May 2014. He continues with followup in the device clinic. Recent device check in December 2014 demonstrated atrial fibrillation 4.8% of the time, no ventricular arrhythmias, biventricular pacing 98% of the time.  Echocardiogram done in July 2013 showed LVEF up to the range of 50-55% with inferoseptal hypokinesis, grade 1 diastolic dysfunction, trivial mitral regurgitation, left atrial enlargement of mild degree, device wire visualized in right heart.   Weight is the same compared to last May. He reports no progressive leg edema. Did have a fall recently when he lost his balance, has a very small bruise on his left abdomen. Reports stable appetite. No chest pain symptoms. He has not been in the hospital.  I spoke with his wife today, she states that he has been doing fairly well. She has to help him with ADLs and his medications.   Allergies  Allergen Reactions  . Celecoxib   . Penicillins   . Zolpidem Tartrate     Current Outpatient Prescriptions  Medication Sig Dispense Refill  . acetaminophen (TYLENOL) 500 MG tablet Take 500 mg by mouth every 12 (twelve) hours.       Marland Kitchen amiodarone (PACERONE) 200 MG tablet Take 0.5 tablets (100 mg total) by mouth daily.  15 tablet  6  . aspirin 81 MG tablet Take 81 mg by mouth daily.        Marland Kitchen atorvastatin (LIPITOR) 10 MG tablet Take 10 mg by mouth daily.        . B Complex-C-Folic Acid (RENAL MULTIVITAMIN FORMULA PO) Take 1 capsule by mouth daily.        . diazepam (VALIUM) 5 MG tablet Take 5 mg by mouth every 8 (eight) hours as needed.       . digoxin (LANOXIN) 0.125 MG tablet Take 125 mcg by mouth every other day.       . DULoxetine (CYMBALTA) 60 MG capsule Take 60 mg by mouth daily.        . ergocalciferol (VITAMIN D2) 50000 UNITS capsule Take 50,000 Units by mouth once a week.        . fluticasone (FLONASE) 50 MCG/ACT nasal spray 2 sprays by Nasal route daily.        . furosemide  (LASIX) 80 MG tablet Take 120 mg by mouth 2 (two) times daily.       Marland Kitchen gabapentin (NEURONTIN) 100 MG capsule Take 100 mg by mouth daily.        . insulin NPH (HUMULIN N) 100 UNIT/ML injection Inject 55 Units into the skin 2 (two) times daily.       . insulin regular (HUMULIN R,NOVOLIN R) 100 UNIT/ML injection Inject into the skin daily. 7-10 units bid        . irbesartan (AVAPRO) 150 MG tablet Take 150 mg by mouth at bedtime.        . isosorbide mononitrate (IMDUR) 60 MG 24 hr tablet Take 1 tablet (60 mg total) by mouth daily.  30 tablet  6  . levothyroxine (SYNTHROID, LEVOTHROID) 100 MCG tablet Take 100 mcg by mouth daily.        Marland Kitchen loratadine (CLARITIN) 10 MG tablet Take 10 mg by mouth daily.        . metoprolol succinate (TOPROL-XL) 25 MG 24 hr tablet TAKE 1 TABLET BY MOUTH TWICE DAILY  60 tablet  6  . morphine (MS CONTIN) 30 MG 12 hr tablet Take 30 mg by mouth 3 (  three) times daily.        Marland Kitchen omeprazole (PRILOSEC) 20 MG capsule Take 20 mg by mouth daily.        . Rivaroxaban (XARELTO) 15 MG TABS tablet Take 15 mg by mouth daily.       No current facility-administered medications for this visit.    Past Medical History  Diagnosis Date  . Chronic systolic heart failure     LVEF 20% up to 50%  . ICD (implantable cardiac defibrillator) in place     RV lead fractured (9983 fidelis lead) and therefore now programmed with VT/VF therapies off  . Ischemic cardiomyopathy   . Atrial fibrillation   . Mitral regurgitation   . Coronary atherosclerosis of native coronary artery     Multiple percutaneous coronary intervention in the past last cath in 07/2004: totally occluded RCA with left to right collaterals ,and a 70-75% mid to distal obtuse marginal stenosis medical therapy  . Essential hypertension, benign   . Hyperlipidemia   . COPD (chronic obstructive pulmonary disease)     O2 at home  . GERD (gastroesophageal reflux disease)   . Type 2 diabetes mellitus   . Sleep apnea   . Anemia   .  Dementia     Past Surgical History  Procedure Laterality Date  . Insert / replace / remove pacemaker      Medtronic insync sentry 7299-09/04/2004  . Cholecystectomy    . Cystoscopy      Bladder cancer years sgo    Social History [name] reports that he quit smoking about 19 years ago. His smoking use included Cigarettes. He has a 150 pack-year smoking history. He has never used smokeless tobacco. [name] reports that he does not drink alcohol.  Review of Systems As outlined above.  Physical Examination Filed Vitals:   02/16/13 1251  BP: 130/71  Pulse: 80   Filed Weights   02/16/13 1251  Weight: 227 lb (102.967 kg)    No acute distress.  HEENT: Conjunctiva and lids normal, oropharynx clear.  Neck: Supple, no elevated JVP, no thyromegaly.  Lungs: Decreased throughout, nonlabored breathing at rest.  Cardiac: Distant, regular rate and rhythm, no S3, no pericardial rub.  Abdomen: Soft, nontender, bowel sounds present.  Extremities: Trace edema, distal pulses 1-2+.  Skin: Warm and dry.  Musculoskeletal: No kyphosis.  Neuropsychiatric: Alert and oriented x3, affect grossly appropriate.   Problem List and Plan   CARDIOMYOPATHY, ISCHEMIC LVEF 50-55% by most recent assessment, symptomatically stable with stable weight. Has limited functional capacity at baseline. Continue current regimen and observation.  Coronary atherosclerosis of native coronary artery No active angina. Continue medical therapy. He continues on ARB, Lipitor, and beta blocker.  Atrial fibrillation Paroxysmal, symptomatically well controlled on current regimen which includes amiodarone and Xarelto. WE have asked him to stop aspirin.  ICD - IN SITU Biventricular ICD with VT/VF therapies turned off due to RV lead fracture. He is followed by Dr. Rayann Heman.    Satira Sark, M.D., F.A.C.C.

## 2013-02-16 NOTE — Patient Instructions (Signed)
Continue all current medications. Your physician wants you to follow up in: 6 months.  You will receive a reminder letter in the mail one-two months in advance.  If you don't receive a letter, please call our office to schedule the follow up appointment   

## 2013-02-16 NOTE — Assessment & Plan Note (Signed)
Biventricular ICD with VT/VF therapies turned off due to RV lead fracture. He is followed by Dr. Rayann Heman.

## 2013-02-16 NOTE — Assessment & Plan Note (Signed)
LVEF 50-55% by most recent assessment, symptomatically stable with stable weight. Has limited functional capacity at baseline. Continue current regimen and observation.

## 2013-02-16 NOTE — Assessment & Plan Note (Signed)
No active angina. Continue medical therapy. He continues on ARB, Lipitor, and beta blocker.

## 2013-02-19 ENCOUNTER — Other Ambulatory Visit: Payer: Self-pay | Admitting: *Deleted

## 2013-03-07 ENCOUNTER — Other Ambulatory Visit: Payer: Self-pay | Admitting: *Deleted

## 2013-03-07 MED ORDER — ISOSORBIDE MONONITRATE ER 60 MG PO TB24: 60.0000 mg | ORAL_TABLET | Freq: Every day | ORAL | Status: DC

## 2013-03-21 ENCOUNTER — Encounter: Payer: Self-pay | Admitting: Internal Medicine

## 2013-03-21 ENCOUNTER — Ambulatory Visit (INDEPENDENT_AMBULATORY_CARE_PROVIDER_SITE_OTHER): Payer: Medicare Other | Admitting: *Deleted

## 2013-03-21 DIAGNOSIS — I4891 Unspecified atrial fibrillation: Secondary | ICD-10-CM

## 2013-03-22 DIAGNOSIS — I4891 Unspecified atrial fibrillation: Secondary | ICD-10-CM

## 2013-03-25 LAB — MDC_IDC_ENUM_SESS_TYPE_REMOTE
Brady Statistic AP VP Percent: 0.4 %
Brady Statistic AS VP Percent: 97.86 %
Brady Statistic AS VS Percent: 1.72 %
Date Time Interrogation Session: 20150305162011
HighPow Impedance: 66 Ohm
HighPow Impedance: 81 Ohm
Lead Channel Impedance Value: 2033 Ohm
Lead Channel Impedance Value: 323 Ohm
Lead Channel Impedance Value: 532 Ohm
Lead Channel Impedance Value: 551 Ohm
Lead Channel Pacing Threshold Amplitude: 1.75 V
Lead Channel Sensing Intrinsic Amplitude: 4 mV
Lead Channel Setting Pacing Amplitude: 2.5 V
Lead Channel Setting Pacing Amplitude: 2.75 V
Lead Channel Setting Pacing Pulse Width: 0.4 ms
Lead Channel Setting Sensing Sensitivity: 0.3 mV
MDC IDC MSMT BATTERY VOLTAGE: 2.91 V
MDC IDC MSMT LEADCHNL LV IMPEDANCE VALUE: 646 Ohm
MDC IDC MSMT LEADCHNL LV PACING THRESHOLD PULSEWIDTH: 0.4 ms
MDC IDC MSMT LEADCHNL RV SENSING INTR AMPL: 24.25 mV
MDC IDC SET LEADCHNL RA PACING AMPLITUDE: 2 V
MDC IDC SET LEADCHNL RV PACING PULSEWIDTH: 0.6 ms
MDC IDC SET ZONE DETECTION INTERVAL: 350 ms
MDC IDC STAT BRADY AP VS PERCENT: 0.02 %
MDC IDC STAT BRADY RA PERCENT PACED: 0.42 %
MDC IDC STAT BRADY RV PERCENT PACED: 98.26 %
Zone Setting Detection Interval: 300 ms
Zone Setting Detection Interval: 340 ms
Zone Setting Detection Interval: 450 ms

## 2013-04-03 ENCOUNTER — Other Ambulatory Visit: Payer: Self-pay | Admitting: Cardiology

## 2013-04-03 MED ORDER — ISOSORBIDE MONONITRATE ER 60 MG PO TB24: 60.0000 mg | ORAL_TABLET | Freq: Every day | ORAL | Status: AC

## 2013-04-03 MED ORDER — AMIODARONE HCL 200 MG PO TABS: 100.0000 mg | ORAL_TABLET | Freq: Every day | ORAL | Status: AC

## 2013-04-18 ENCOUNTER — Encounter: Payer: Self-pay | Admitting: *Deleted

## 2013-06-11 ENCOUNTER — Inpatient Hospital Stay (HOSPITAL_COMMUNITY): Payer: Medicare Other

## 2013-06-11 ENCOUNTER — Encounter (HOSPITAL_COMMUNITY): Payer: Self-pay | Admitting: *Deleted

## 2013-06-11 DIAGNOSIS — I4729 Other ventricular tachycardia: Secondary | ICD-10-CM | POA: Diagnosis not present

## 2013-06-11 DIAGNOSIS — I2510 Atherosclerotic heart disease of native coronary artery without angina pectoris: Secondary | ICD-10-CM | POA: Diagnosis present

## 2013-06-11 DIAGNOSIS — T50995A Adverse effect of other drugs, medicaments and biological substances, initial encounter: Secondary | ICD-10-CM | POA: Diagnosis present

## 2013-06-11 DIAGNOSIS — N183 Chronic kidney disease, stage 3 unspecified: Secondary | ICD-10-CM | POA: Diagnosis present

## 2013-06-11 DIAGNOSIS — N179 Acute kidney failure, unspecified: Secondary | ICD-10-CM | POA: Diagnosis present

## 2013-06-11 DIAGNOSIS — I472 Ventricular tachycardia, unspecified: Secondary | ICD-10-CM | POA: Diagnosis not present

## 2013-06-11 DIAGNOSIS — I4891 Unspecified atrial fibrillation: Secondary | ICD-10-CM

## 2013-06-11 DIAGNOSIS — R570 Cardiogenic shock: Secondary | ICD-10-CM

## 2013-06-11 DIAGNOSIS — N039 Chronic nephritic syndrome with unspecified morphologic changes: Secondary | ICD-10-CM

## 2013-06-11 DIAGNOSIS — Z794 Long term (current) use of insulin: Secondary | ICD-10-CM

## 2013-06-11 DIAGNOSIS — I2582 Chronic total occlusion of coronary artery: Secondary | ICD-10-CM | POA: Diagnosis present

## 2013-06-11 DIAGNOSIS — Z87891 Personal history of nicotine dependence: Secondary | ICD-10-CM

## 2013-06-11 DIAGNOSIS — K219 Gastro-esophageal reflux disease without esophagitis: Secondary | ICD-10-CM | POA: Diagnosis present

## 2013-06-11 DIAGNOSIS — Z9981 Dependence on supplemental oxygen: Secondary | ICD-10-CM

## 2013-06-11 DIAGNOSIS — Z9861 Coronary angioplasty status: Secondary | ICD-10-CM

## 2013-06-11 DIAGNOSIS — D75839 Thrombocytosis, unspecified: Secondary | ICD-10-CM | POA: Diagnosis present

## 2013-06-11 DIAGNOSIS — I5043 Acute on chronic combined systolic (congestive) and diastolic (congestive) heart failure: Secondary | ICD-10-CM | POA: Diagnosis present

## 2013-06-11 DIAGNOSIS — Z7901 Long term (current) use of anticoagulants: Secondary | ICD-10-CM

## 2013-06-11 DIAGNOSIS — R7989 Other specified abnormal findings of blood chemistry: Secondary | ICD-10-CM | POA: Diagnosis present

## 2013-06-11 DIAGNOSIS — IMO0002 Reserved for concepts with insufficient information to code with codable children: Secondary | ICD-10-CM | POA: Diagnosis present

## 2013-06-11 DIAGNOSIS — I5033 Acute on chronic diastolic (congestive) heart failure: Secondary | ICD-10-CM

## 2013-06-11 DIAGNOSIS — I130 Hypertensive heart and chronic kidney disease with heart failure and stage 1 through stage 4 chronic kidney disease, or unspecified chronic kidney disease: Secondary | ICD-10-CM | POA: Diagnosis present

## 2013-06-11 DIAGNOSIS — E875 Hyperkalemia: Secondary | ICD-10-CM | POA: Diagnosis not present

## 2013-06-11 DIAGNOSIS — Z95810 Presence of automatic (implantable) cardiac defibrillator: Secondary | ICD-10-CM

## 2013-06-11 DIAGNOSIS — I10 Essential (primary) hypertension: Secondary | ICD-10-CM

## 2013-06-11 DIAGNOSIS — D649 Anemia, unspecified: Secondary | ICD-10-CM | POA: Diagnosis present

## 2013-06-11 DIAGNOSIS — I5023 Acute on chronic systolic (congestive) heart failure: Secondary | ICD-10-CM

## 2013-06-11 DIAGNOSIS — L97409 Non-pressure chronic ulcer of unspecified heel and midfoot with unspecified severity: Secondary | ICD-10-CM | POA: Diagnosis present

## 2013-06-11 DIAGNOSIS — D72829 Elevated white blood cell count, unspecified: Secondary | ICD-10-CM | POA: Diagnosis present

## 2013-06-11 DIAGNOSIS — Z8551 Personal history of malignant neoplasm of bladder: Secondary | ICD-10-CM

## 2013-06-11 DIAGNOSIS — E871 Hypo-osmolality and hyponatremia: Secondary | ICD-10-CM | POA: Diagnosis present

## 2013-06-11 DIAGNOSIS — N189 Chronic kidney disease, unspecified: Secondary | ICD-10-CM

## 2013-06-11 DIAGNOSIS — Z91199 Patient's noncompliance with other medical treatment and regimen due to unspecified reason: Secondary | ICD-10-CM

## 2013-06-11 DIAGNOSIS — I319 Disease of pericardium, unspecified: Secondary | ICD-10-CM | POA: Diagnosis not present

## 2013-06-11 DIAGNOSIS — I272 Pulmonary hypertension, unspecified: Secondary | ICD-10-CM

## 2013-06-11 DIAGNOSIS — G4730 Sleep apnea, unspecified: Secondary | ICD-10-CM | POA: Diagnosis present

## 2013-06-11 DIAGNOSIS — Z9119 Patient's noncompliance with other medical treatment and regimen: Secondary | ICD-10-CM

## 2013-06-11 DIAGNOSIS — F0390 Unspecified dementia without behavioral disturbance: Secondary | ICD-10-CM | POA: Diagnosis present

## 2013-06-11 DIAGNOSIS — E785 Hyperlipidemia, unspecified: Secondary | ICD-10-CM | POA: Diagnosis present

## 2013-06-11 DIAGNOSIS — I509 Heart failure, unspecified: Secondary | ICD-10-CM | POA: Diagnosis present

## 2013-06-11 DIAGNOSIS — Z515 Encounter for palliative care: Secondary | ICD-10-CM

## 2013-06-11 DIAGNOSIS — J9600 Acute respiratory failure, unspecified whether with hypoxia or hypercapnia: Secondary | ICD-10-CM

## 2013-06-11 DIAGNOSIS — J9620 Acute and chronic respiratory failure, unspecified whether with hypoxia or hypercapnia: Secondary | ICD-10-CM | POA: Diagnosis present

## 2013-06-11 DIAGNOSIS — G8929 Other chronic pain: Secondary | ICD-10-CM | POA: Diagnosis present

## 2013-06-11 DIAGNOSIS — D473 Essential (hemorrhagic) thrombocythemia: Secondary | ICD-10-CM | POA: Diagnosis present

## 2013-06-11 DIAGNOSIS — T462X5A Adverse effect of other antidysrhythmic drugs, initial encounter: Secondary | ICD-10-CM | POA: Diagnosis present

## 2013-06-11 DIAGNOSIS — R160 Hepatomegaly, not elsewhere classified: Secondary | ICD-10-CM

## 2013-06-11 DIAGNOSIS — J449 Chronic obstructive pulmonary disease, unspecified: Secondary | ICD-10-CM

## 2013-06-11 DIAGNOSIS — J4489 Other specified chronic obstructive pulmonary disease: Secondary | ICD-10-CM | POA: Diagnosis present

## 2013-06-11 DIAGNOSIS — I059 Rheumatic mitral valve disease, unspecified: Secondary | ICD-10-CM | POA: Diagnosis present

## 2013-06-11 DIAGNOSIS — I2789 Other specified pulmonary heart diseases: Secondary | ICD-10-CM | POA: Diagnosis present

## 2013-06-11 DIAGNOSIS — I214 Non-ST elevation (NSTEMI) myocardial infarction: Principal | ICD-10-CM | POA: Diagnosis present

## 2013-06-11 DIAGNOSIS — E119 Type 2 diabetes mellitus without complications: Secondary | ICD-10-CM | POA: Diagnosis present

## 2013-06-11 DIAGNOSIS — Z66 Do not resuscitate: Secondary | ICD-10-CM | POA: Diagnosis present

## 2013-06-11 DIAGNOSIS — G9340 Encephalopathy, unspecified: Secondary | ICD-10-CM | POA: Diagnosis not present

## 2013-06-11 DIAGNOSIS — K769 Liver disease, unspecified: Secondary | ICD-10-CM | POA: Diagnosis present

## 2013-06-11 DIAGNOSIS — E1165 Type 2 diabetes mellitus with hyperglycemia: Secondary | ICD-10-CM | POA: Diagnosis present

## 2013-06-11 DIAGNOSIS — Z7982 Long term (current) use of aspirin: Secondary | ICD-10-CM

## 2013-06-11 DIAGNOSIS — I4510 Unspecified right bundle-branch block: Secondary | ICD-10-CM | POA: Diagnosis present

## 2013-06-11 DIAGNOSIS — M549 Dorsalgia, unspecified: Secondary | ICD-10-CM | POA: Diagnosis present

## 2013-06-11 DIAGNOSIS — R34 Anuria and oliguria: Secondary | ICD-10-CM | POA: Diagnosis not present

## 2013-06-11 DIAGNOSIS — I2589 Other forms of chronic ischemic heart disease: Secondary | ICD-10-CM | POA: Diagnosis present

## 2013-06-11 DIAGNOSIS — IMO0001 Reserved for inherently not codable concepts without codable children: Secondary | ICD-10-CM | POA: Diagnosis not present

## 2013-06-11 DIAGNOSIS — R0902 Hypoxemia: Secondary | ICD-10-CM | POA: Diagnosis present

## 2013-06-11 LAB — GLUCOSE, CAPILLARY: Glucose-Capillary: 118 mg/dL — ABNORMAL HIGH (ref 70–99)

## 2013-06-11 LAB — DIGOXIN LEVEL: Digoxin Level: 1 ng/mL (ref 0.8–2.0)

## 2013-06-11 LAB — TROPONIN I: TROPONIN I: 14.56 ng/mL — AB (ref <0.30)

## 2013-06-11 LAB — MRSA PCR SCREENING: MRSA by PCR: NEGATIVE

## 2013-06-11 MED ORDER — SODIUM CHLORIDE 0.9 % IJ SOLN: 3.0000 mL | INTRAMUSCULAR | Status: DC | PRN

## 2013-06-11 MED ORDER — DULOXETINE HCL 60 MG PO CPEP
60.0000 mg | ORAL_CAPSULE | ORAL | Status: DC
  Administered 2013-06-11 – 2013-06-16 (×6): 60 mg via ORAL
  Filled 2013-06-11 (×7): qty 1

## 2013-06-11 MED ORDER — SODIUM CHLORIDE 0.9 % IJ SOLN
3.0000 mL | Freq: Two times a day (BID) | INTRAMUSCULAR | Status: DC
  Administered 2013-06-12 – 2013-06-15 (×7): 3 mL via INTRAVENOUS

## 2013-06-11 MED ORDER — AMIODARONE HCL IN DEXTROSE 360-4.14 MG/200ML-% IV SOLN
60.0000 mg/h | INTRAVENOUS | Status: AC
  Administered 2013-06-11 (×2): 60 mg/h via INTRAVENOUS
  Filled 2013-06-11: qty 200

## 2013-06-11 MED ORDER — NITROGLYCERIN 0.4 MG SL SUBL: 0.4000 mg | SUBLINGUAL_TABLET | SUBLINGUAL | Status: DC | PRN

## 2013-06-11 MED ORDER — FLUTICASONE PROPIONATE 50 MCG/ACT NA SUSP
2.0000 | Freq: Every day | NASAL | Status: DC
  Administered 2013-06-12 – 2013-06-16 (×4): 2 via NASAL
  Filled 2013-06-11: qty 16

## 2013-06-11 MED ORDER — RENAL MULTIVITAMIN FORMULA PO TABS: 1.0000 | ORAL_TABLET | Freq: Every day | ORAL | Status: DC

## 2013-06-11 MED ORDER — DIGOXIN 125 MCG PO TABS
125.0000 ug | ORAL_TABLET | ORAL | Status: DC
  Administered 2013-06-11 – 2013-06-15 (×3): 125 ug via ORAL
  Filled 2013-06-11 (×3): qty 1

## 2013-06-11 MED ORDER — ASPIRIN 81 MG PO CHEW
324.0000 mg | CHEWABLE_TABLET | Freq: Every day | ORAL | Status: AC
  Administered 2013-06-11 – 2013-06-12 (×2): 324 mg via ORAL
  Filled 2013-06-11 (×2): qty 4

## 2013-06-11 MED ORDER — INSULIN NPH (HUMAN) (ISOPHANE) 100 UNIT/ML ~~LOC~~ SUSP
55.0000 [IU] | Freq: Two times a day (BID) | SUBCUTANEOUS | Status: DC
  Filled 2013-06-11: qty 10

## 2013-06-11 MED ORDER — MORPHINE SULFATE ER 15 MG PO TBCR
30.0000 mg | EXTENDED_RELEASE_TABLET | Freq: Three times a day (TID) | ORAL | Status: DC
  Administered 2013-06-11 – 2013-06-16 (×14): 30 mg via ORAL
  Filled 2013-06-11 (×15): qty 2

## 2013-06-11 MED ORDER — FUROSEMIDE 10 MG/ML IJ SOLN
INTRAMUSCULAR | Status: AC
  Filled 2013-06-11: qty 8

## 2013-06-11 MED ORDER — DIAZEPAM 5 MG PO TABS
5.0000 mg | ORAL_TABLET | Freq: Three times a day (TID) | ORAL | Status: DC | PRN
  Administered 2013-06-11: 5 mg via ORAL
  Filled 2013-06-11: qty 1

## 2013-06-11 MED ORDER — ACETAMINOPHEN 325 MG PO TABS: 650.0000 mg | ORAL_TABLET | ORAL | Status: DC | PRN

## 2013-06-11 MED ORDER — HEPARIN (PORCINE) IN NACL 100-0.45 UNIT/ML-% IJ SOLN
1450.0000 [IU]/h | INTRAMUSCULAR | Status: DC
  Administered 2013-06-11: 1350 [IU]/h via INTRAVENOUS
  Administered 2013-06-12: 1450 [IU]/h via INTRAVENOUS
  Filled 2013-06-11 (×2): qty 250

## 2013-06-11 MED ORDER — PANTOPRAZOLE SODIUM 40 MG PO TBEC
40.0000 mg | DELAYED_RELEASE_TABLET | Freq: Every day | ORAL | Status: DC
  Administered 2013-06-12 – 2013-06-16 (×5): 40 mg via ORAL
  Filled 2013-06-11 (×5): qty 1

## 2013-06-11 MED ORDER — LEVOTHYROXINE SODIUM 100 MCG PO TABS
100.0000 ug | ORAL_TABLET | Freq: Every day | ORAL | Status: DC
  Administered 2013-06-12 – 2013-06-17 (×6): 100 ug via ORAL
  Filled 2013-06-11 (×7): qty 1

## 2013-06-11 MED ORDER — ASPIRIN 81 MG PO CHEW: 81.0000 mg | CHEWABLE_TABLET | Freq: Every day | ORAL | Status: DC

## 2013-06-11 MED ORDER — FUROSEMIDE 10 MG/ML IJ SOLN
80.0000 mg | Freq: Two times a day (BID) | INTRAMUSCULAR | Status: DC
  Administered 2013-06-11 – 2013-06-14 (×6): 80 mg via INTRAVENOUS
  Filled 2013-06-11 (×9): qty 8

## 2013-06-11 MED ORDER — GABAPENTIN 100 MG PO CAPS
100.0000 mg | ORAL_CAPSULE | ORAL | Status: DC
  Administered 2013-06-11 – 2013-06-16 (×6): 100 mg via ORAL
  Filled 2013-06-11 (×7): qty 1

## 2013-06-11 MED ORDER — ATORVASTATIN CALCIUM 80 MG PO TABS
80.0000 mg | ORAL_TABLET | Freq: Every day | ORAL | Status: DC
  Administered 2013-06-11 – 2013-06-16 (×6): 80 mg via ORAL
  Filled 2013-06-11 (×7): qty 1

## 2013-06-11 MED ORDER — ACETAMINOPHEN 500 MG PO TABS
500.0000 mg | ORAL_TABLET | Freq: Two times a day (BID) | ORAL | Status: DC
  Administered 2013-06-11 – 2013-06-16 (×11): 500 mg via ORAL
  Filled 2013-06-11 (×15): qty 1

## 2013-06-11 MED ORDER — ISOSORBIDE MONONITRATE ER 60 MG PO TB24
60.0000 mg | ORAL_TABLET | ORAL | Status: DC
  Administered 2013-06-11 – 2013-06-14 (×4): 60 mg via ORAL
  Filled 2013-06-11 (×5): qty 1

## 2013-06-11 MED ORDER — AMIODARONE HCL IN DEXTROSE 360-4.14 MG/200ML-% IV SOLN
INTRAVENOUS | Status: AC
  Administered 2013-06-11: 60 mg/h via INTRAVENOUS
  Filled 2013-06-11: qty 200

## 2013-06-11 MED ORDER — RENA-VITE PO TABS
1.0000 | ORAL_TABLET | Freq: Every day | ORAL | Status: DC
  Administered 2013-06-11 – 2013-06-16 (×6): 1 via ORAL
  Filled 2013-06-11 (×10): qty 1

## 2013-06-11 MED ORDER — AMIODARONE HCL IN DEXTROSE 360-4.14 MG/200ML-% IV SOLN
30.0000 mg/h | INTRAVENOUS | Status: DC
Start: 2013-06-12 — End: 2013-06-13
  Administered 2013-06-12 – 2013-06-13 (×3): 30 mg/h via INTRAVENOUS
  Filled 2013-06-11 (×6): qty 200

## 2013-06-11 MED ORDER — INSULIN ASPART 100 UNIT/ML ~~LOC~~ SOLN
0.0000 [IU] | Freq: Three times a day (TID) | SUBCUTANEOUS | Status: DC
Start: 2013-06-12 — End: 2013-06-12

## 2013-06-11 MED ORDER — METOPROLOL SUCCINATE ER 25 MG PO TB24
25.0000 mg | ORAL_TABLET | ORAL | Status: DC
  Administered 2013-06-11 – 2013-06-14 (×4): 25 mg via ORAL
  Filled 2013-06-11 (×5): qty 1

## 2013-06-11 MED ORDER — LORATADINE 10 MG PO TABS
10.0000 mg | ORAL_TABLET | Freq: Every day | ORAL | Status: DC
  Administered 2013-06-12 – 2013-06-16 (×5): 10 mg via ORAL
  Filled 2013-06-11 (×6): qty 1

## 2013-06-11 MED ORDER — ALPRAZOLAM 0.25 MG PO TABS: 0.2500 mg | ORAL_TABLET | Freq: Two times a day (BID) | ORAL | Status: DC | PRN

## 2013-06-11 MED ORDER — AMIODARONE LOAD VIA INFUSION
150.0000 mg | Freq: Once | INTRAVENOUS | Status: AC
  Administered 2013-06-11: 150 mg via INTRAVENOUS
  Filled 2013-06-11: qty 83.34

## 2013-06-11 MED ORDER — ASPIRIN 81 MG PO CHEW
81.0000 mg | CHEWABLE_TABLET | Freq: Every day | ORAL | Status: DC
  Administered 2013-06-13 – 2013-06-16 (×4): 81 mg via ORAL
  Filled 2013-06-11 (×4): qty 1

## 2013-06-11 MED ORDER — ONDANSETRON HCL 4 MG/2ML IJ SOLN: 4.0000 mg | Freq: Four times a day (QID) | INTRAMUSCULAR | Status: DC | PRN

## 2013-06-11 MED ORDER — SODIUM CHLORIDE 0.9 % IV SOLN
250.0000 mL | INTRAVENOUS | Status: DC | PRN
  Administered 2013-06-16: 250 mL via INTRAVENOUS

## 2013-06-11 NOTE — H&P (Signed)
History and Physical   Patient ID: [name] MRN: [id number], DOB/AGE: 1927/09/11 80 y.o. Date of Encounter: 05/27/2013  Primary Physician: Sherrie Mustache, MD Primary Cardiologist: Dr. Domenic Polite  Chief Complaint:  NSTEMI  HPI: [name] is a 80 y.o. male with a history of CAD. He also has thrombocytosis, atrial fibrillation on Xarelto for anticoagulation and a biventricular ICD with 6949-lead. The VT/VF therapies are turned off.   He developed increasing shortness of breath and his wife got him to the emergency room at [facility] There, his troponin was greater than 30, he was short of breath requiring BiPAP and he has renal insufficiency. His ECG has an underlying bundle branch block and he was not having chest pain. He was transferred to [facility] and admitted as a non-STEMI. Of note, he reportedly received IV fluids greater than 1 L while he was in the emergency room. He was given Lasix 20 mg IV x1.  [name] does not remember having any chest pain. His shortness of breath is improved on the oxygen. He denies palpitations. He is not aware of a rapid or irregular heartbeat.   Past Medical History  Diagnosis Date  . Chronic systolic heart failure     LVEF 20% up to 50%  . ICD (implantable cardiac defibrillator) in place     RV lead fractured (4315 fidelis lead) and therefore now programmed with VT/VF therapies off  . Ischemic cardiomyopathy   . Atrial fibrillation   . Mitral regurgitation   . Coronary atherosclerosis of native coronary artery     Multiple percutaneous coronary intervention in the past last cath in 07/2004: totally occluded RCA with left to right collaterals ,and a 70-75% mid to distal obtuse marginal stenosis medical therapy  . Essential hypertension, benign   . Hyperlipidemia   . COPD (chronic obstructive pulmonary disease)     O2 at home  . GERD (gastroesophageal reflux disease)   . Type 2 diabetes mellitus   . Sleep apnea    . Anemia   . Dementia     Surgical History:  Past Surgical History  Procedure Laterality Date  . Insert / replace / remove pacemaker      Medtronic insync sentry 7299-09/04/2004  . Cholecystectomy    . Cystoscopy      Bladder cancer years sgo  . Cardiac catheterization  2006    RCA subtotaled ISR, LAD non-obs dz, Diag s/p PTCA, OK; OM1 70%  . Coronary angioplasty with stent placement  1997    Stent to the RCA     I have reviewed the patient's current medications. Prior to Admission medications   Medication Sig Start Date End Date Taking? Authorizing Provider  acetaminophen (TYLENOL) 500 MG tablet Take 500 mg by mouth every 12 (twelve) hours.     Historical Provider, MD  amiodarone (PACERONE) 200 MG tablet Take 0.5 tablets (100 mg total) by mouth daily. 04/03/13   Satira Sark, MD  atorvastatin (LIPITOR) 10 MG tablet Take 10 mg by mouth daily.      Historical Provider, MD  B Complex-C-Folic Acid (RENAL MULTIVITAMIN FORMULA PO) Take 1 capsule by mouth daily.      Historical Provider, MD  diazepam (VALIUM) 5 MG tablet Take 5 mg by mouth every 8 (eight) hours as needed.     Historical Provider, MD  digoxin (LANOXIN) 0.125 MG tablet Take 125 mcg by mouth every other day.     Historical Provider, MD  DULoxetine (  CYMBALTA) 60 MG capsule Take 60 mg by mouth daily.      Historical Provider, MD  ergocalciferol (VITAMIN D2) 50000 UNITS capsule Take 50,000 Units by mouth once a week.      Historical Provider, MD  fluticasone (FLONASE) 50 MCG/ACT nasal spray 2 sprays by Nasal route daily.      Historical Provider, MD  furosemide (LASIX) 80 MG tablet Take 120 mg by mouth 2 (two) times daily.     Historical Provider, MD  gabapentin (NEURONTIN) 100 MG capsule Take 100 mg by mouth daily.      Historical Provider, MD  insulin NPH (HUMULIN N) 100 UNIT/ML injection Inject 55 Units into the skin 2 (two) times daily.     Historical Provider, MD  insulin regular (HUMULIN R,NOVOLIN R) 100 UNIT/ML  injection Inject into the skin daily. 7-10 units bid      Historical Provider, MD  irbesartan (AVAPRO) 150 MG tablet Take 150 mg by mouth at bedtime.      Historical Provider, MD  isosorbide mononitrate (IMDUR) 60 MG 24 hr tablet Take 1 tablet (60 mg total) by mouth daily. 04/03/13   Satira Sark, MD  levothyroxine (SYNTHROID, LEVOTHROID) 100 MCG tablet Take 100 mcg by mouth daily.      Historical Provider, MD  loratadine (CLARITIN) 10 MG tablet Take 10 mg by mouth daily.      Historical Provider, MD  metoprolol succinate (TOPROL-XL) 25 MG 24 hr tablet TAKE 1 TABLET BY MOUTH TWICE DAILY 06/09/12   Satira Sark, MD  morphine (MS CONTIN) 30 MG 12 hr tablet Take 30 mg by mouth 3 (three) times daily.      Historical Provider, MD  omeprazole (PRILOSEC) 20 MG capsule Take 20 mg by mouth daily.      Historical Provider, MD  Rivaroxaban (XARELTO) 15 MG TABS tablet Take 15 mg by mouth daily.    Historical Provider, MD   Scheduled Meds: Continuous Infusions: PRN Meds:.    Allergies:  Allergies  Allergen Reactions  . Celecoxib   . Penicillins   . Zolpidem Tartrate     History   Social History  . Marital Status: Married    Spouse Name: N/A    Number of Children: N/A  . Years of Education: N/A   Occupational History  . retired    Social History Main Topics  . Smoking status: Former Smoker -- 3.00 packs/day for 50 years    Types: Cigarettes    Quit date: 01/18/1994  . Smokeless tobacco: Never Used  . Alcohol Use: No  . Drug Use: No  . Sexual Activity: Not on file   Other Topics Concern  . Not on file   Social History Narrative  . Lives with wife   Family Status  Relation Status Death Age  . Mother Deceased   . Father Deceased    Review of Systems: He has chronic back pain for which she takes narcotics.  Full 14-point review of systems otherwise negative except as noted above.  Physical Exam: Blood pressure 121/60, pulse 94, temperature 97.7 F (36.5 C),  temperature source Oral, resp. rate 19, height 6' (1.829 m), weight 221 lb 1.9 oz (100.3 kg), SpO2 97.00%. General: Well developed, well nourished,male in no acute distress. Head: Normocephalic, atraumatic, sclera non-icteric, no xanthomas, nares are without discharge. Dentition: poor Neck: No carotid bruits. JVD elevated at 12 cm. No thyromegally Lungs: Good expansion bilaterally. without wheezes or rhonchi. Bibasilar rales Heart: IRRegular rate and rhythm with  S1 S2.  No S3 or S4.  2/6 murmur, no rubs, or gallops appreciated. Abdomen: Soft, non-tender, non-distended with normoactive bowel sounds. No hepatomegaly. No rebound/guarding. No obvious abdominal masses. Msk:  Strength and tone appear normal for age. No joint deformities or effusions, no spine or costo-vertebral angle tenderness. Extremities: No clubbing or cyanosis. No edema.  Distal pedal pulses are 2+ in 4 extrem Neuro: Alert and oriented X 2. Moves all extremities spontaneously. No focal deficits noted. Psych:  Responds to questions appropriately with a normal affect. Skin: No rashes or lesions noted  Labs:  Lab Results  Component Value Date   WBC 21.9  06/10/2013   HGB 19.8*     HCT 60.9     MCV 94.1     PLT 169          BNP  1288         SODIUM 134    POTASSIUM 4.4    CL 92    CO2 28    BUN 37    CR 1.71    GLU 66         MAGNESIUM 1.8         CK 1010    CKMB 31.6    TROP 31.05         LACTATE 2.5               Radiology/Studies: performed at [facility] - Increased patchy interstitial prominence or early ASDz at left lung base, on chronic lung dz, possibly due to inflammation or progressive COPD, No typical edema   Cardiac Cath: ASSESSMENT:  1. Subtotaled right coronary artery with left-to-right collaterals.  2. Moderate nonobstructive disease in the left anterior descending with a  patent stent. The previously angioplastied diagonal also looks good.  3. Borderline lesion in the distal obtuse marginal 1 which  is little change  since 2004.  4. Normal to slightly elevated filling pressures.  Echo: 07/21/2011 Study Conclusions - Left ventricle: The cavity size was normal. Wall thickness was increased in a pattern of moderate LVH. Systolic function was normal. The estimated ejection fraction was in the range of 50% to 55%. Probable hypokinesis of the inferoseptal myocardium. Doppler parameters are consistent with abnormal left ventricular relaxation (grade 1 diastolic dysfunction). - Aortic valve: Mildly calcified annulus. Trileaflet; moderately calcified leaflets. No significant regurgitation. Mean gradient: 47mm Hg (S). - Mitral valve: Calcified annulus. Trivial regurgitation. - Left atrium: The atrium was mildly dilated. - Right ventricle: Pacer wire or catheter noted in right ventricle. - Tricuspid valve: Trivial regurgitation. Peak RV-RA gradient: 10mm Hg (S). - Pericardium, extracardiac: There was no pericardial effusion.  ECG: pending  ASSESSMENT AND PLAN:  Principal Problem:   NSTEMI (non-ST elevated myocardial infarction) Active Problems:   HYPERLIPIDEMIA-MIXED   Essential hypertension, benign   CARDIOMYOPATHY, ISCHEMIC   COPD   Thrombocytosis   CHF (congestive heart failure)   Signed, Lonn Georgia, PA-C 05/29/2013 7:42 PM Beeper [phone]  Patient seen with PA, agree with the above note. 1 CAD: NSTEMI.  Patient has history of CAD (07/2004 cath with occluded RCA and collaterals).  He has not had chest pain, just has felt "bad" and "very weak" since last night.  Went to [place] this morning.  Troponin was 31 when he was at [facility] this morning so transferred here.  No chest pain currently.  He is short of breath off oxygen with low O2 sats.  ECG atrial fibrillation with predominant BiV pacing.  - Heparin gtt,  hold Xarelto (last dose of Xarelto was 5/24 pm).   - ASA, atorvastatin 80 mg daily.  - LHC in morning as long as he remains stable.  2. Atrial fibrillation:  History of PAF.  He is in atrial fibrillation currently. He has been on amiodarone at home.  Atrial fibrillation will make his BiV pacing much less efficient and may be contributing to current CHF exacerbation.  - Will place him on amiodarone gtt for now.  If he remains in atrial fibrillation, will likely need DCCV in the future.  - Xarelto held for cath, he is on heparin gtt.  - Continue home Toprol XL.  3. Acute on chronic diastolic CHF: EF 99991111 (improved) in 2013. He is volume overloaded on exam with elevated JVP.  He is requiring face mask for oxygenation.  He is supposed to be on home oxygen for COPD but does not wear it per his wife.  - Lasix 80 mg IV bid, 1st dose now.   - Hold irbesartan with elevated creatinine.  - Continue Toprol XL.  - Echo to reassess EF.  4. CKD: Creatinine 1.7.  Will need to follow closely with diuresis.  Hold irbesartan.  Will need Lasix with volume overload/hypoxemia. Will need to minimize contrast with cath, will not do LV-gram.  5. COPD: Home oxygen at baseline.  Suspect worsening oxygen saturation is due to CHF.  6. Suspect dementia though formal diagnosis is not listed in chart.  7. DNR/DNI.   Larey Dresser 06/07/2013 8:00 PM

## 2013-06-11 NOTE — Progress Notes (Addendum)
ANTICOAGULATION CONSULT NOTE - Initial Consult  Pharmacy Consult for heparin Indication: chest pain/ACS  Allergies  Allergen Reactions  . Celecoxib   . Penicillins   . Zolpidem Tartrate     Patient Measurements: Height: 6' (182.9 cm) Weight: 221 lb 1.9 oz (100.3 kg) IBW/kg (Calculated) : 77.6 Heparin Dosing Weight: 98 kg  Vital Signs: Temp: 98.9 F (37.2 C) (05/25 1951) Temp src: Axillary (05/25 1951) BP: 121/60 mmHg (05/25 1830) Pulse Rate: 94 (05/25 1830)  Labs: No results found for this basename: HGB, HCT, PLT, APTT, LABPROT, INR, HEPARINUNFRC, CREATININE, CKTOTAL, CKMB, TROPONINI,  in the last 72 hours  Estimated Creatinine Clearance: 49 ml/min (by C-G formula based on Cr of 1.4).   Medical History: Past Medical History  Diagnosis Date  . Chronic systolic heart failure     LVEF 20% up to 50%  . ICD (implantable cardiac defibrillator) in place     RV lead fractured (5573 fidelis lead) and therefore now programmed with VT/VF therapies off  . Ischemic cardiomyopathy   . Atrial fibrillation   . Mitral regurgitation   . Coronary atherosclerosis of native coronary artery     Multiple percutaneous coronary intervention in the past last cath in 07/2004: totally occluded RCA with left to right collaterals ,and a 70-75% mid to distal obtuse marginal stenosis medical therapy  . Essential hypertension, benign   . Hyperlipidemia   . COPD (chronic obstructive pulmonary disease)     O2 at home  . GERD (gastroesophageal reflux disease)   . Type 2 diabetes mellitus   . Sleep apnea   . Anemia   . Dementia     Medications:  See EMR  Assessment: 80 year old male on xarelto prior to admission for history of AFib.  Last dose of Xarelto reportedly taken on the morning of 5/24.  Presents with shortness of breath, weakness.  Outside hospital started pt on heparin gtt, which has been continue here.  Significant labs from 06/05/2013 at outside hospital: INR 1.4, SCr 1.7, Tropnin 31,  WBC 21.9, hgb 19.8, plts 169.   Goal of Therapy:  Heparin level 0.3-0.7 units/ml Monitor platelets by anticoagulation protocol: Yes   Plan:  Continue heparin gtt 1360 units/hr Heparin level at 0100 Daily HL, CBC F/u plans for cath    Hughes Better, PharmD, BCPS Clinical Pharmacist Pager: [phone] 05/25/2013 8:07 PM    Addendum: Heparin level slightly below desired goal range of 0.3-0.7 on IV rate of 1360 units/hr.  No noted bleeding complications.  Plan: Increase rate to 1450 units/hr Recheck heparin level in 6 hours and adjust  Rober Minion, PharmD., MS Clinical Pharmacist Pager:  [phone] Thank you for allowing pharmacy to be part of this patients care team.

## 2013-06-12 ENCOUNTER — Encounter (HOSPITAL_COMMUNITY): Admission: AD | Disposition: E | Payer: Medicare Other | Source: Other Acute Inpatient Hospital | Attending: Cardiology

## 2013-06-12 DIAGNOSIS — I319 Disease of pericardium, unspecified: Secondary | ICD-10-CM

## 2013-06-12 DIAGNOSIS — I5033 Acute on chronic diastolic (congestive) heart failure: Secondary | ICD-10-CM

## 2013-06-12 DIAGNOSIS — I2510 Atherosclerotic heart disease of native coronary artery without angina pectoris: Secondary | ICD-10-CM

## 2013-06-12 DIAGNOSIS — Z95810 Presence of automatic (implantable) cardiac defibrillator: Secondary | ICD-10-CM

## 2013-06-12 DIAGNOSIS — N183 Chronic kidney disease, stage 3 unspecified: Secondary | ICD-10-CM

## 2013-06-12 HISTORY — PX: LEFT AND RIGHT HEART CATHETERIZATION WITH CORONARY ANGIOGRAM: SHX5449

## 2013-06-12 LAB — CBC
HCT: 57.3 % — ABNORMAL HIGH (ref 39.0–52.0)
HEMOGLOBIN: 19.4 g/dL — AB (ref 13.0–17.0)
MCH: 31.7 pg (ref 26.0–34.0)
MCHC: 33.9 g/dL (ref 30.0–36.0)
MCV: 93.6 fL (ref 78.0–100.0)
Platelets: 140 10*3/uL — ABNORMAL LOW (ref 150–400)
RBC: 6.12 MIL/uL — AB (ref 4.22–5.81)
RDW: 15.7 % — ABNORMAL HIGH (ref 11.5–15.5)
WBC: 19.4 10*3/uL — ABNORMAL HIGH (ref 4.0–10.5)

## 2013-06-12 LAB — TROPONIN I
Troponin I: 13.34 ng/mL (ref <0.30)
Troponin I: 7.92 ng/mL (ref <0.30)

## 2013-06-12 LAB — TSH: TSH: 1.61 u[IU]/mL (ref 0.350–4.500)

## 2013-06-12 LAB — GLUCOSE, CAPILLARY
GLUCOSE-CAPILLARY: 233 mg/dL — AB (ref 70–99)
GLUCOSE-CAPILLARY: 236 mg/dL — AB (ref 70–99)
Glucose-Capillary: 219 mg/dL — ABNORMAL HIGH (ref 70–99)
Glucose-Capillary: 227 mg/dL — ABNORMAL HIGH (ref 70–99)
Glucose-Capillary: 225 mg/dL — ABNORMAL HIGH (ref 70–99)
Glucose-Capillary: 242 mg/dL — ABNORMAL HIGH (ref 70–99)

## 2013-06-12 LAB — POCT I-STAT 3, ART BLOOD GAS (G3+)
Acid-base deficit: 3 mmol/L — ABNORMAL HIGH (ref 0.0–2.0)
Acid-base deficit: 1 mmol/L (ref 0.0–2.0)
Bicarbonate: 22.9 mEq/L (ref 20.0–24.0)
Bicarbonate: 26.7 mEq/L — ABNORMAL HIGH (ref 20.0–24.0)
O2 SAT: 93 %
O2 Saturation: 57 %
PCO2 ART: 53.6 mmHg — AB (ref 35.0–45.0)
PH ART: 7.306 — AB (ref 7.350–7.450)
TCO2: 24 mmol/L (ref 0–100)
TCO2: 28 mmol/L (ref 0–100)
pCO2 arterial: 41.9 mmHg (ref 35.0–45.0)
pH, Arterial: 7.346 — ABNORMAL LOW (ref 7.350–7.450)
pO2, Arterial: 70 mmHg — ABNORMAL LOW (ref 80.0–100.0)
pO2, Arterial: 33 mmHg — CL (ref 80.0–100.0)

## 2013-06-12 LAB — COMPREHENSIVE METABOLIC PANEL
ALT: 26 U/L (ref 0–53)
AST: 95 U/L — ABNORMAL HIGH (ref 0–37)
Albumin: 2.6 g/dL — ABNORMAL LOW (ref 3.5–5.2)
Alkaline Phosphatase: 64 U/L (ref 39–117)
BILIRUBIN TOTAL: 0.9 mg/dL (ref 0.3–1.2)
BUN: 43 mg/dL — AB (ref 6–23)
CHLORIDE: 91 meq/L — AB (ref 96–112)
CO2: 21 meq/L (ref 19–32)
Calcium: 9.2 mg/dL (ref 8.4–10.5)
Creatinine, Ser: 1.77 mg/dL — ABNORMAL HIGH (ref 0.50–1.35)
GFR calc Af Amer: 39 mL/min — ABNORMAL LOW (ref >90)
GFR calc non Af Amer: 34 mL/min — ABNORMAL LOW (ref >90)
Glucose, Bld: 183 mg/dL — ABNORMAL HIGH (ref 70–99)
Potassium: 5.3 mEq/L (ref 3.7–5.3)
Sodium: 129 mEq/L — ABNORMAL LOW (ref 137–147)
Total Protein: 7 g/dL (ref 6.0–8.3)

## 2013-06-12 LAB — HEPARIN LEVEL (UNFRACTIONATED)
HEPARIN UNFRACTIONATED: 0.26 [IU]/mL — AB (ref 0.30–0.70)
Heparin Unfractionated: 0.21 IU/mL — ABNORMAL LOW (ref 0.30–0.70)

## 2013-06-12 LAB — LIPID PANEL
CHOLESTEROL: 68 mg/dL (ref 0–200)
HDL: 14 mg/dL — ABNORMAL LOW (ref >39)
LDL Cholesterol: 36 mg/dL (ref 0–99)
TRIGLYCERIDES: 88 mg/dL (ref <150)
Total CHOL/HDL Ratio: 4.9 RATIO
VLDL: 18 mg/dL (ref 0–40)

## 2013-06-12 LAB — MAGNESIUM: Magnesium: 2.1 mg/dL (ref 1.5–2.5)

## 2013-06-12 LAB — APTT: aPTT: 77 seconds — ABNORMAL HIGH (ref 24–37)

## 2013-06-12 LAB — PROTIME-INR
INR: 1.42 (ref 0.00–1.49)
Prothrombin Time: 17 seconds — ABNORMAL HIGH (ref 11.6–15.2)

## 2013-06-12 LAB — POCT ACTIVATED CLOTTING TIME
ACTIVATED CLOTTING TIME: 177 s
Activated Clotting Time: 221 seconds

## 2013-06-12 LAB — HEMOGLOBIN A1C
Hgb A1c MFr Bld: 8.6 % — ABNORMAL HIGH (ref <5.7)
Mean Plasma Glucose: 200 mg/dL — ABNORMAL HIGH (ref <117)

## 2013-06-12 SURGERY — LEFT AND RIGHT HEART CATHETERIZATION WITH CORONARY ANGIOGRAM
Anesthesia: LOCAL

## 2013-06-12 MED ORDER — LIDOCAINE HCL (PF) 1 % IJ SOLN
INTRAMUSCULAR | Status: AC
  Filled 2013-06-12: qty 30

## 2013-06-12 MED ORDER — VERAPAMIL HCL 2.5 MG/ML IV SOLN
INTRAVENOUS | Status: AC
  Filled 2013-06-12: qty 2

## 2013-06-12 MED ORDER — SODIUM CHLORIDE 0.9 % IV SOLN
250.0000 mL | INTRAVENOUS | Status: DC | PRN
  Administered 2013-06-15: 250 mL via INTRAVENOUS

## 2013-06-12 MED ORDER — SODIUM CHLORIDE 0.9 % IV SOLN: 1.0000 mL/kg/h | INTRAVENOUS | Status: AC

## 2013-06-12 MED ORDER — FAMOTIDINE IN NACL 20-0.9 MG/50ML-% IV SOLN
INTRAVENOUS | Status: AC
  Filled 2013-06-12: qty 50

## 2013-06-12 MED ORDER — DIPHENHYDRAMINE HCL 50 MG/ML IJ SOLN
INTRAMUSCULAR | Status: AC
  Filled 2013-06-12: qty 1

## 2013-06-12 MED ORDER — HEPARIN (PORCINE) IN NACL 100-0.45 UNIT/ML-% IJ SOLN
1600.0000 [IU]/h | INTRAMUSCULAR | Status: AC
  Filled 2013-06-12: qty 250

## 2013-06-12 MED ORDER — SODIUM CHLORIDE 0.9 % IV SOLN: 250.0000 mL | INTRAVENOUS | Status: DC | PRN

## 2013-06-12 MED ORDER — SODIUM CHLORIDE 0.9 % IJ SOLN: 3.0000 mL | INTRAMUSCULAR | Status: DC | PRN

## 2013-06-12 MED ORDER — FENTANYL CITRATE 0.05 MG/ML IJ SOLN
INTRAMUSCULAR | Status: AC
  Filled 2013-06-12: qty 2

## 2013-06-12 MED ORDER — SODIUM CHLORIDE 0.9 % IV SOLN: INTRAVENOUS | Status: DC

## 2013-06-12 MED ORDER — SODIUM CHLORIDE 0.9 % IJ SOLN
3.0000 mL | Freq: Two times a day (BID) | INTRAMUSCULAR | Status: DC
  Administered 2013-06-12: 3 mL via INTRAVENOUS

## 2013-06-12 MED ORDER — INSULIN ASPART 100 UNIT/ML ~~LOC~~ SOLN
0.0000 [IU] | SUBCUTANEOUS | Status: DC
  Administered 2013-06-12 (×3): 5 [IU] via SUBCUTANEOUS
  Administered 2013-06-13: 3 [IU] via SUBCUTANEOUS
  Administered 2013-06-13: 5 [IU] via SUBCUTANEOUS

## 2013-06-12 MED ORDER — MIDAZOLAM HCL 2 MG/2ML IJ SOLN
INTRAMUSCULAR | Status: AC
  Filled 2013-06-12: qty 2

## 2013-06-12 MED ORDER — NITROGLYCERIN 0.2 MG/ML ON CALL CATH LAB
INTRAVENOUS | Status: AC
  Filled 2013-06-12: qty 1

## 2013-06-12 MED ORDER — HEPARIN (PORCINE) IN NACL 2-0.9 UNIT/ML-% IJ SOLN
INTRAMUSCULAR | Status: AC
Start: 2013-06-12 — End: 2013-06-12
  Filled 2013-06-12: qty 1500

## 2013-06-12 MED ORDER — HEPARIN SODIUM (PORCINE) 1000 UNIT/ML IJ SOLN
INTRAMUSCULAR | Status: AC
  Filled 2013-06-12: qty 1

## 2013-06-12 MED ORDER — SODIUM CHLORIDE 0.9 % IJ SOLN
3.0000 mL | Freq: Two times a day (BID) | INTRAMUSCULAR | Status: DC
  Administered 2013-06-13 – 2013-06-15 (×5): 3 mL via INTRAVENOUS

## 2013-06-12 NOTE — Progress Notes (Signed)
ANTICOAGULATION CONSULT NOTE - Follow Up Consult  Pharmacy Consult for heparin gtt Indication: chest pain/ACS  Allergies  Allergen Reactions  . Celecoxib   . Iodine   . Penicillins   . Sulfa Antibiotics   . Zolpidem Tartrate     Patient Measurements: Height: 6' (182.9 cm) Weight: 219 lb 12.8 oz (99.7 kg) IBW/kg (Calculated) : 77.6 Heparin Dosing Weight: 97.8 kg  Vital Signs: Temp: 97.5 F (36.4 C) (05/26 1110) Temp src: Oral (05/26 1110) BP: 95/36 mmHg (05/26 1200) Pulse Rate: 43 (05/26 1247)  Labs:  Recent Labs  06/08/2013 2215 05/22/2013 0110 05/30/2013 0845 05/20/2013 1220  HGB  --  19.4*  --   --   HCT  --  57.3*  --   --   PLT  --  140*  --   --   APTT  --   --   --  77*  LABPROT  --   --   --  17.0*  INR  --   --   --  1.42  HEPARINUNFRC  --  0.26*  --  0.21*  CREATININE  --  1.77*  --   --   TROPONINI 14.56* 13.34* 7.92*  --     Estimated Creatinine Clearance: 38.6 ml/min (by C-G formula based on Cr of 1.77).   Medications:  Scheduled:  . acetaminophen  500 mg Oral Q12H  . [START ON 06/13/2013] aspirin  81 mg Oral Daily  . atorvastatin  80 mg Oral Daily  . b complex-vitamin c-folic acid  1 tablet Oral QHS  . digoxin  125 mcg Oral QODAY  . DULoxetine  60 mg Oral Q24H  . fluticasone  2 spray Each Nare Daily  . furosemide  80 mg Intravenous BID  . gabapentin  100 mg Oral Q24H  . insulin aspart  0-15 Units Subcutaneous 6 times per day  . isosorbide mononitrate  60 mg Oral Q24H  . levothyroxine  100 mcg Oral QAC breakfast  . loratadine  10 mg Oral Daily  . metoprolol succinate  25 mg Oral Q24H  . morphine  30 mg Oral TID  . pantoprazole  40 mg Oral Daily  . sodium chloride  3 mL Intravenous Q12H  . sodium chloride  3 mL Intravenous Q12H   Infusions:  . sodium chloride 1 mL/kg/hr (06/11/2013 1545)  . amiodarone 30 mg/hr (06/04/2013 1500)    Assessment: 79 yo M (on PTA Xarelto for afib) originally presented with shortness of breath and weakness to  [facility] where heparin gtt started for ACS and elevated troponins.  Therapy was continued at [facility] for NSTEMI, but now patient is s/p cath.  Pharmacy consulted to resume heparin gtt 8h post sheath removal (removed ~1500).  Heparin level this morning was subtherapeutic at 0.21 and aPTT 77 prior to cath (reflecting correlation and no longer need for aPTT d/t PTA Xarelto).  These labs are with heparin gtt running at 1450 units/hr.  Initial  H/H at 19.4/57.3 and plt low at 144.  SCr is 1.77 with CrCl ~39.  No bleeding complications noted.  Goal of Therapy:  Heparin level 0.3-0.7 units/ml Monitor platelets by anticoagulation protocol: Yes   Plan:  - resume heparin IV gtt at 11pm tonight at 1600 units/hr - draw 8h HL - daily HL and CBC - monitor for s/s of bleeding - f/u transition back to long-term anticoagulation plans  Ovid Curd E. Jacqlyn Larsen, PharmD Clinical Pharmacist - Resident Pager: [phone] Pharmacy: [phone] 06/16/2013 4:34 PM

## 2013-06-12 NOTE — Progress Notes (Signed)
Echocardiogram 2D Echocardiogram has been performed.  [name] 06/08/2013, 11:58 AM

## 2013-06-12 NOTE — Progress Notes (Signed)
SUBJECTIVE:   80 y.o. male with a history of CAD, atrial fibrillation on Xarelto and biventricular ICD admitted with NSTEMI with elevated trop to 14>>13. ECG atrial fibrillation with predominant BiV pacing Started on heparin gtt.   Denies chest pain or SOB. Complaining of back pain.    Marland Kitchen acetaminophen  500 mg Oral Q12H  . aspirin  324 mg Oral Daily  . [START ON 06/13/2013] aspirin  81 mg Oral Daily  . atorvastatin  80 mg Oral Daily  . b complex-vitamin c-folic acid  1 tablet Oral QHS  . digoxin  125 mcg Oral QODAY  . DULoxetine  60 mg Oral Q24H  . fluticasone  2 spray Each Nare Daily  . furosemide  80 mg Intravenous BID  . gabapentin  100 mg Oral Q24H  . insulin aspart  0-15 Units Subcutaneous TID WC  . insulin NPH Human  55 Units Subcutaneous BID AC & HS  . isosorbide mononitrate  60 mg Oral Q24H  . levothyroxine  100 mcg Oral QAC breakfast  . loratadine  10 mg Oral Daily  . metoprolol succinate  25 mg Oral Q24H  . morphine  30 mg Oral TID  . pantoprazole  40 mg Oral Daily  . sodium chloride  3 mL Intravenous Q12H  . sodium chloride  3 mL Intravenous Q12H   . [START ON 06/13/2013] sodium chloride    . amiodarone 30 mg/hr (06/11/2013 0530)  . heparin 1,450 Units/hr (05/29/2013 0330)     PHYSICAL EXAM Filed Vitals:   06/03/2013 0430 06/09/2013 0500 06/02/2013 0530 06/15/2013 0600  BP: 115/57 115/58 116/62 117/65  Pulse: 83 90 90 88  Temp:      TempSrc:      Resp: 14 16 19 14   Height:      Weight:  219 lb 12.8 oz (99.7 kg)    SpO2: 96% 95% 97% 96%   General: A&OX3. No acute distress.  Neck: No carotid bruits. JVD at 5cm Lungs: clear bilaterally,  Heart: IRRegular rate and rhythm with S1 S2. No S3 or S4. 2/6 murmur, no rubs, or gallops appreciated.  Abdomen: Soft, non-tender, non-distended with normoactive bowel sounds. No hepatomegaly. No rebound/guarding. No obvious abdominal masses.  Msk: movies all extremities.  Extremities: No clubbing or cyanosis. No edema. Distal pedal  pulses are 2+ Skin: No rashes or lesions noted  LABS: Lab Results  Component Value Date   TROPONINI 13.34* 06/11/2013   Results for orders placed during the hospital encounter of 07-07-13 (from the past 24 hour(s))  MRSA PCR SCREENING     Status: None   Collection Time    Jul 07, 2013  5:05 PM      Result Value Ref Range   MRSA by PCR NEGATIVE  NEGATIVE  GLUCOSE, CAPILLARY     Status: Abnormal   Collection Time    2013-07-07  6:40 PM      Result Value Ref Range   Glucose-Capillary 118 (*) 70 - 99 mg/dL  TROPONIN I     Status: Abnormal   Collection Time    07/07/13 10:15 PM      Result Value Ref Range   Troponin I 14.56 (*) <0.30 ng/mL  TSH     Status: None   Collection Time    07/07/13 10:15 PM      Result Value Ref Range   TSH 1.610  0.350 - 4.500 uIU/mL  DIGOXIN LEVEL     Status: None   Collection Time  06/08/2013 10:15 PM      Result Value Ref Range   Digoxin Level 1.0  0.8 - 2.0 ng/mL  COMPREHENSIVE METABOLIC PANEL     Status: Abnormal   Collection Time    06/06/2013  1:10 AM      Result Value Ref Range   Sodium 129 (*) 137 - 147 mEq/L   Potassium 5.3  3.7 - 5.3 mEq/L   Chloride 91 (*) 96 - 112 mEq/L   CO2 21  19 - 32 mEq/L   Glucose, Bld 183 (*) 70 - 99 mg/dL   BUN 43 (*) 6 - 23 mg/dL   Creatinine, Ser 1.77 (*) 0.50 - 1.35 mg/dL   Calcium 9.2  8.4 - 10.5 mg/dL   Total Protein 7.0  6.0 - 8.3 g/dL   Albumin 2.6 (*) 3.5 - 5.2 g/dL   AST 95 (*) 0 - 37 U/L   ALT 26  0 - 53 U/L   Alkaline Phosphatase 64  39 - 117 U/L   Total Bilirubin 0.9  0.3 - 1.2 mg/dL   GFR calc non Af Amer 34 (*) >90 mL/min   GFR calc Af Amer 39 (*) >90 mL/min  TROPONIN I     Status: Abnormal   Collection Time    05/26/2013  1:10 AM      Result Value Ref Range   Troponin I 13.34 (*) <0.30 ng/mL  MAGNESIUM     Status: None   Collection Time    06/10/2013  1:10 AM      Result Value Ref Range   Magnesium 2.1  1.5 - 2.5 mg/dL  HEPARIN LEVEL (UNFRACTIONATED)     Status: Abnormal   Collection Time     05/28/2013  1:10 AM      Result Value Ref Range   Heparin Unfractionated 0.26 (*) 0.30 - 0.70 IU/mL  CBC     Status: Abnormal   Collection Time    05/27/2013  1:10 AM      Result Value Ref Range   WBC 19.4 (*) 4.0 - 10.5 K/uL   RBC 6.12 (*) 4.22 - 5.81 MIL/uL   Hemoglobin 19.4 (*) 13.0 - 17.0 g/dL   HCT 57.3 (*) 39.0 - 52.0 %   MCV 93.6  78.0 - 100.0 fL   MCH 31.7  26.0 - 34.0 pg   MCHC 33.9  30.0 - 36.0 g/dL   RDW 15.7 (*) 11.5 - 15.5 %   Platelets 140 (*) 150 - 400 K/uL  LIPID PANEL     Status: Abnormal   Collection Time    06/10/2013  1:10 AM      Result Value Ref Range   Cholesterol 68  0 - 200 mg/dL   Triglycerides 88  <150 mg/dL   HDL 14 (*) >39 mg/dL   Total CHOL/HDL Ratio 4.9     VLDL 18  0 - 40 mg/dL   LDL Cholesterol 36  0 - 99 mg/dL    Intake/Output Summary (Last 24 hours) at 06/07/2013 0739 Last data filed at 06/03/2013 0600  Gross per 24 hour  Intake 1257.5 ml  Output   1051 ml  Net  206.5 ml    EKG:  Biventricular pacing.   ASSESSMENT AND PLAN:  Principal Problem:   NSTEMI (non-ST elevated myocardial infarction) Active Problems:   HYPERLIPIDEMIA-MIXED   Essential hypertension, benign   CARDIOMYOPATHY, ISCHEMIC   COPD   Thrombocytosis   CHF (congestive heart failure)   CAD: NSTEMI. Patient has history of  CAD (07/2004 cath with occluded RCA and collaterals). Denies chest pain or SOB today. Trop 14>13.  Plan  - Heparin gtt, hold Xarelto (last dose of Xarelto was 5/24 pm).  - ASA, atorvastatin 80 mg daily.  - LHC in morning as long as he remains stable.   Atrial fibrillation: History of PAF. EKG no afib this am. On home amiodarone. Atrial fibrillation will make his BiV pacing much less efficient and may be contributing to current CHF exacerbation.  Plan  - cont with amiodarone gtt . Consider DCCV in the future if he remains in afib.  - Xarelto held for cath, - cont with heparin gtt.  - Continue home Toprol XL.   Acute on chronic diastolic CHF: EF 75-91%  (improved) in 2013. Still has JVP at 5cm. He is requiring face mask for oxygenation. Noncompliant with home O2 for COPD. Wt down by 2lb - Lasix 80 mg IV bid, - Hold irbesartan with elevated creatinine.  - Continue Toprol XL.  - Echo to reassess EF.  Leukocytosis: likely stress induced. No fevers. Will panculture and consider abx if suscipicion of infection  CKD: Creatinine 1.7. Will need to follow closely with diuresis. Hold irbesartan. Cont with lasix for volume overload/hypoxemia. Will need to minimize contrast with cath, will not do LV-gram.  COPD: Home oxygen at baseline. Suspect worsening oxygen saturation is due to CHF.  Possible dementia: no formal diagnosis Diabetes: on insulin therapy Chronic back pain: cont with home MS-contin Code status: DNR/DNI.   Discussed with Dr Martinique.   Signed:  Jessee Avers, MD PGY-2 Internal Medicine Teaching Service Pager: [phone] 2013-06-14, 8:03 AM  Patient seen and examined and history reviewed. Agree with above findings and plan. Patient is pain free and states dyspnea is back to baseline. Still in Afib with controlled rate. Getting reloaded with amiodarone. Troponin significantly elevated c/w NSTEMI. Extensive coronary history. Need to be cautious with contrast load due to CKD. Received over a liter of fluid at [facility] last night. No LV gram today. Would favor radial approach since he received Xarelto the night before last. Proceed with right and left heart cath today. Echo pending. Hold ARB.  Ander Slade [facility] Jun 14, 2013 11:01 AM

## 2013-06-12 NOTE — Care Management Note (Addendum)
    Page 1 of 2   06/16/2013     4:10:58 PM CARE MANAGEMENT NOTE 06/16/2013  Patient:  [name], [name]   Account Number:  [id number]  Date Initiated:  06/07/2013  Documentation initiated by:  Elissa Hefty  Subjective/Objective Assessment:   adm w mi     Action/Plan:   lives w wife, pcp dr Hollice Espy [place]   Anticipated DC Date:     Anticipated DC Plan:        [place]  CM consult      PAC Choice  IP [place]   Choice offered to / List presented to:  C-1 Patient        [place] arranged  HH-1 RN  [place]      [place].   Status of service:   Medicare Important Message given?  YES (If response is "NO", the following Medicare IM given date fields will be blank) Date Medicare IM given:  2013/07/02 Date Additional Medicare IM given:    Discharge Disposition:    Per UR Regulation:  Reviewed for med. necessity/level of care/duration of stay  If discussed at [place] of Stay Meetings, dates discussed:    Comments:  06/16/13 16:00 CM received call from RN requesting [employer] be utilized in reaching son of pt to come home as pt is declining rapidly.  CM spoke with CSW who will pursue.  No other CM needs at this time.  Mariane Masters, BSN, [place].   5/29 4540 debbie dowell rn,bsn spoke w pt. he lives w wife. they have no other support than each other. he does want to return home at disch. he is agreeable to hhc. wife has bad hips and can't do alot to help pt. lives [place] no pref to hhc agencies. tent hhc ref to [place] for rn-pt-sw. phy ther rec cir consult and this would be great for pt to get more strength before going home. cir consult obtained. pt has walker at home. will cont to follow.

## 2013-06-12 NOTE — Progress Notes (Signed)
Pt transferred to cath lab via bed with Massie Maroon (transporter) on tele, heparin gtts d/c, report given to cath lab rn.

## 2013-06-12 NOTE — Consult Note (Signed)
WOC consult requested for bilat heel wounds.  Pt in cath lab and might not return to room until late afternoon. Bedside nurse states pt has foam dressings intact to heels.  Will assess tomorrow for further plan of care. Julien Girt MSN, RN, [place], [place], [place]

## 2013-06-12 NOTE — H&P (View-Only) (Signed)
  SUBJECTIVE:   80 y.o. male with a history of CAD, atrial fibrillation on Xarelto and biventricular ICD admitted with NSTEMI with elevated trop to 14>>13. ECG atrial fibrillation with predominant BiV pacing Started on heparin gtt.   Denies chest pain or SOB. Complaining of back pain.    . acetaminophen  500 mg Oral Q12H  . aspirin  324 mg Oral Daily  . [START ON 06/13/2013] aspirin  81 mg Oral Daily  . atorvastatin  80 mg Oral Daily  . b complex-vitamin c-folic acid  1 tablet Oral QHS  . digoxin  125 mcg Oral QODAY  . DULoxetine  60 mg Oral Q24H  . fluticasone  2 spray Each Nare Daily  . furosemide  80 mg Intravenous BID  . gabapentin  100 mg Oral Q24H  . insulin aspart  0-15 Units Subcutaneous TID WC  . insulin NPH Human  55 Units Subcutaneous BID AC & HS  . isosorbide mononitrate  60 mg Oral Q24H  . levothyroxine  100 mcg Oral QAC breakfast  . loratadine  10 mg Oral Daily  . metoprolol succinate  25 mg Oral Q24H  . morphine  30 mg Oral TID  . pantoprazole  40 mg Oral Daily  . sodium chloride  3 mL Intravenous Q12H  . sodium chloride  3 mL Intravenous Q12H   . [START ON 06/13/2013] sodium chloride    . amiodarone 30 mg/hr (06/08/2013 0530)  . heparin 1,450 Units/hr (06/09/2013 0330)     PHYSICAL EXAM Filed Vitals:   05/30/2013 0430 05/24/2013 0500 06/09/2013 0530 06/08/2013 0600  BP: 115/57 115/58 116/62 117/65  Pulse: 83 90 90 88  Temp:      TempSrc:      Resp: 14 16 19 14  Height:      Weight:  219 lb 12.8 oz (99.7 kg)    SpO2: 96% 95% 97% 96%   General: A&OX3. No acute distress.  Neck: No carotid bruits. JVD at 5cm Lungs: clear bilaterally,  Heart: IRRegular rate and rhythm with S1 S2. No S3 or S4. 2/6 murmur, no rubs, or gallops appreciated.  Abdomen: Soft, non-tender, non-distended with normoactive bowel sounds. No hepatomegaly. No rebound/guarding. No obvious abdominal masses.  Msk: movies all extremities.  Extremities: No clubbing or cyanosis. No edema. Distal pedal  pulses are 2+ Skin: No rashes or lesions noted  LABS: Lab Results  Component Value Date   TROPONINI 13.34* 06/02/2013   Results for orders placed during the hospital encounter of 05/30/2013 (from the past 24 hour(s))  MRSA PCR SCREENING     Status: None   Collection Time    05/29/2013  5:05 PM      Result Value Ref Range   MRSA by PCR NEGATIVE  NEGATIVE  GLUCOSE, CAPILLARY     Status: Abnormal   Collection Time    06/10/2013  6:40 PM      Result Value Ref Range   Glucose-Capillary 118 (*) 70 - 99 mg/dL  TROPONIN I     Status: Abnormal   Collection Time    05/29/2013 10:15 PM      Result Value Ref Range   Troponin I 14.56 (*) <0.30 ng/mL  TSH     Status: None   Collection Time    05/26/2013 10:15 PM      Result Value Ref Range   TSH 1.610  0.350 - 4.500 uIU/mL  DIGOXIN LEVEL     Status: None   Collection Time      06/08/2013 10:15 PM      Result Value Ref Range   Digoxin Level 1.0  0.8 - 2.0 ng/mL  COMPREHENSIVE METABOLIC PANEL     Status: Abnormal   Collection Time    06/09/2013  1:10 AM      Result Value Ref Range   Sodium 129 (*) 137 - 147 mEq/L   Potassium 5.3  3.7 - 5.3 mEq/L   Chloride 91 (*) 96 - 112 mEq/L   CO2 21  19 - 32 mEq/L   Glucose, Bld 183 (*) 70 - 99 mg/dL   BUN 43 (*) 6 - 23 mg/dL   Creatinine, Ser 1.77 (*) 0.50 - 1.35 mg/dL   Calcium 9.2  8.4 - 10.5 mg/dL   Total Protein 7.0  6.0 - 8.3 g/dL   Albumin 2.6 (*) 3.5 - 5.2 g/dL   AST 95 (*) 0 - 37 U/L   ALT 26  0 - 53 U/L   Alkaline Phosphatase 64  39 - 117 U/L   Total Bilirubin 0.9  0.3 - 1.2 mg/dL   GFR calc non Af Amer 34 (*) >90 mL/min   GFR calc Af Amer 39 (*) >90 mL/min  TROPONIN I     Status: Abnormal   Collection Time    05/26/2013  1:10 AM      Result Value Ref Range   Troponin I 13.34 (*) <0.30 ng/mL  MAGNESIUM     Status: None   Collection Time    06/10/2013  1:10 AM      Result Value Ref Range   Magnesium 2.1  1.5 - 2.5 mg/dL  HEPARIN LEVEL (UNFRACTIONATED)     Status: Abnormal   Collection Time     05/28/2013  1:10 AM      Result Value Ref Range   Heparin Unfractionated 0.26 (*) 0.30 - 0.70 IU/mL  CBC     Status: Abnormal   Collection Time    05/27/2013  1:10 AM      Result Value Ref Range   WBC 19.4 (*) 4.0 - 10.5 K/uL   RBC 6.12 (*) 4.22 - 5.81 MIL/uL   Hemoglobin 19.4 (*) 13.0 - 17.0 g/dL   HCT 57.3 (*) 39.0 - 52.0 %   MCV 93.6  78.0 - 100.0 fL   MCH 31.7  26.0 - 34.0 pg   MCHC 33.9  30.0 - 36.0 g/dL   RDW 15.7 (*) 11.5 - 15.5 %   Platelets 140 (*) 150 - 400 K/uL  LIPID PANEL     Status: Abnormal   Collection Time    06/10/2013  1:10 AM      Result Value Ref Range   Cholesterol 68  0 - 200 mg/dL   Triglycerides 88  <150 mg/dL   HDL 14 (*) >39 mg/dL   Total CHOL/HDL Ratio 4.9     VLDL 18  0 - 40 mg/dL   LDL Cholesterol 36  0 - 99 mg/dL    Intake/Output Summary (Last 24 hours) at 06/07/2013 0739 Last data filed at 06/03/2013 0600  Gross per 24 hour  Intake 1257.5 ml  Output   1051 ml  Net  206.5 ml    EKG:  Biventricular pacing.   ASSESSMENT AND PLAN:  Principal Problem:   NSTEMI (non-ST elevated myocardial infarction) Active Problems:   HYPERLIPIDEMIA-MIXED   Essential hypertension, benign   CARDIOMYOPATHY, ISCHEMIC   COPD   Thrombocytosis   CHF (congestive heart failure)   CAD: NSTEMI. Patient has history of  CAD (07/2004 cath with occluded RCA and collaterals). Denies chest pain or SOB today. Trop 14>13.  Plan  - Heparin gtt, hold Xarelto (last dose of Xarelto was 5/24 pm).  - ASA, atorvastatin 80 mg daily.  - LHC in morning as long as he remains stable.   Atrial fibrillation: History of PAF. EKG no afib this am. On home amiodarone. Atrial fibrillation will make his BiV pacing much less efficient and may be contributing to current CHF exacerbation.  Plan  - cont with amiodarone gtt . Consider DCCV in the future if he remains in afib.  - Xarelto held for cath, - cont with heparin gtt.  - Continue home Toprol XL.   Acute on chronic diastolic CHF: EF 50-55%  (improved) in 2013. Still has JVP at 5cm. He is requiring face mask for oxygenation. Noncompliant with home O2 for COPD. Wt down by 2lb - Lasix 80 mg IV bid, - Hold irbesartan with elevated creatinine.  - Continue Toprol XL.  - Echo to reassess EF.  Leukocytosis: likely stress induced. No fevers. Will panculture and consider abx if suscipicion of infection  CKD: Creatinine 1.7. Will need to follow closely with diuresis. Hold irbesartan. Cont with lasix for volume overload/hypoxemia. Will need to minimize contrast with cath, will not do LV-gram.  COPD: Home oxygen at baseline. Suspect worsening oxygen saturation is due to CHF.  Possible dementia: no formal diagnosis Diabetes: on insulin therapy Chronic back pain: cont with home MS-contin Code status: DNR/DNI.   Discussed with Dr Jordan.   Signed:  Richard Kazibwe, MD PGY-2 Internal Medicine Teaching Service Pager: [phone] 06/05/2013, 8:03 AM  Patient seen and examined and history reviewed. Agree with above findings and plan. Patient is pain free and states dyspnea is back to baseline. Still in Afib with controlled rate. Getting reloaded with amiodarone. Troponin significantly elevated c/w NSTEMI. Extensive coronary history. Need to be cautious with contrast load due to CKD. Received over a liter of fluid at [facility] last night. No LV gram today. Would favor radial approach since he received Xarelto the night before last. Proceed with right and left heart cath today. Echo pending. Hold ARB.  Peter M JordanMD,FACC 05/28/2013 11:01 AM    

## 2013-06-12 NOTE — CV Procedure (Signed)
CARDIAC CATHETERIZATION REPORT  NAME:  [name]   MRN: [id number] DOB:  02/04/1927   ADMIT DATE: 06/13/2013 Procedure Date: 05/31/2013  INTERVENTIONAL CARDIOLOGIST: [name], M.D., MS PRIMARY CARE PROVIDER: Sherrie Mustache, MD PRIMARY CARDIOLOGIST: Dr. Rozann Lesches  PATIENT:  [name] is a 80 y.o. male with a history of CAD, atrial fibrillation on Xarelto and biventricular ICD admitted with NSTEMI with elevated trop to 14>>13. ECG atrial fibrillation with predominant BiV pacing.  Started on heparin gtt. Now referred for Right and Left Cardiac Catheterization. He is a history of subtotally occluded RCA with moderate disease and an OM branch.  PRE-OPERATIVE DIAGNOSIS:    Non-STEMI  Known CAD  PROCEDURES PERFORMED:    Right & Left Heart Catheterization with Native Coronary Angiography  via Right Radial Artery & Right Common Femoral Vein Access.  Left Ventriculography  PROCEDURE: The patient was brought to the 2nd [facility] in the fasting state and prepped and draped in the usual sterile fashion for Right groin access. Sterile technique was used including antiseptics, cap, gloves, gown, hand hygiene, mask and sheet. Skin prep: Chlorhexidine.   Consent: Risks of procedure as well as the alternatives and risks of each were explained to the (patient/caregiver). Consent for procedure obtained.   Time Out: Verified patient identification, verified procedure, site/side was marked, verified correct patient position, special equipment/implants available, medications/allergies/relevent history reviewed, required imaging and test results available. Performed.  Access:   Right Radial Artery: 6 Fr Sheath - Seldinger Technique; Micropuncture Angiocath Kit  Radial Cocktail; IV Heparin 5000 Units  Left AC/Brachial IV exchanged for 5 Fr Sheath over wire --> Unable to pass 5Fr Swan beyond LSCV/Innominate due to existing ICD hardware,  despite use of BMW wire. -- aborted.  Attempted Ultrasound Guided Access of R AC-Brachial Venous vein - unsuccessful.  Right Common Femoral Vein: 7 Fr Sheath - Seldinger Technique.  Right Heart Catheterization: 7Fr Gordy Councilman catheter advanced under fluoroscopy with balloon inflated to the RA, RV, then PCWP-PA for hemodynamic measurement.  Simultaneous FA & PA blood gases checked for SaO2% to calculate FICK CO/CI  Thermodilution Injections performed to calculate CO/CI  Simultaneous PCWP/LV & RV/LV pressures monitored with Angled Pigtail in LV.  Catheter removed completely out of the body with balloon deflated.  Left Heart Catheterization: 5 Fr Catheters advanced or exchanged over a J-wire; Long- catheter advanced first.  Left & Right Coronary Artery Cineangiography: TIG 4.0 Catheter  LV Hemodynamics (LV Gram): Angled Pigtail  Venous Sheaths to be removed in the holding area with manual pressure for hemostasis.   MEDICATIONS:   ANESTHESIA: Local Lidocaine 18 ml   SEDATION: 50 mcg IV fentanyl ; 25 mg IV Benadryl  Pepcid 20 mg IV  Omnipaque contrast 75  mL   EBL: < 10 ml, not including ABG and VBG samples   FINDINGS:  Hemodynamics:  Findings:   SaO2%  Pressures mmHg  Mean P  mmHg  EDP  mmHg   Right Atrium    13/13    11   Right Ventricle    60/10    13   Pulmonary Artery   57   63/28   41    PCWP    20/21   20    Central Aortic   93  108/59  76    Left Ventricle    107/14    17          Cardiac Output:   Cardiac Index:  Thermodilution    2.87    1.29    Fick   3.11    1.4     Left Ventriculography: Deferred  Coronary Anatomy: Unable to tell dominance  Left Main:  Large-caliber vessel that bifurcates into the LAD and Circumflex. Mild calcification but otherwise no significant disease. LAD: Large-caliber wraparound vessel that gives rise to 2 diagonal branches. The vessel tapers distally but with no significant CAD.  D1: Moderate caliber vessel. Angiographically  normal.  D2: Moderate caliber vessel with proximal 80% tubular stenosis. Otherwise the remainder the vessel is free of significant disease..  Left Circumflex: Large-caliber vessel that gives off a relatively small OM1 branch proximally before giving off a large lateral OM 2. At this branch point of OM1 there is a roughly 60% stenosis. OM1 has a roughly 80% proximal stenosis. The follow on vessel that gives rise to a third OM/LPL 1 and what appears to be in near distal LPL versus L. PDA. There is no significant disease in the AV groove circumflex and distally.  OM2: Large caliber lateral vessel with a ranges distally. Is a roughly 40-50% proximal stenosis.  OM 2: Small-Moderate caliber vessel with a mid 80% lesion   RCA: Moderate caliber vessel with now 100% occlusion proximal to the visualized stent. Left to right collaterals fill the distal vessel, mostly the PDA.  After reviewing the initial angiography, no obvious PCI amenable target was identified. The branch vessel D2,OM1 and OM 3 lesions are not excellent PCI target.  MEDICATIONS:  Anesthesia:  Local Lidocaine 2 mL for radial access, one each mL for both brachial sites, 12 mL for right common femoral vein access site.  Sedation:  25 mg IV Benadryl, 50 mcg IV fentanyl ;   Premedication: Benadryl +20 mg IV Pepcid for presumed contrast reaction  Omnipaque Contrast: 75 ml  Anticoagulation:  IV Heparin 5000 Units  Radial Cocktail: 5 mg Verapamil, 400 mcg NTG, 2 ml 2% Lidocaine in 10 ml NS  PATIENT DISPOSITION:    The patient was transferred to the PACU holding area in a hemodynamicaly stable, chest pain free condition.  The patient tolerated the procedure well, and there were no complications.  EBL:   < 10 ml  The patient was stable before, during, and after the procedure.  POST-OPERATIVE DIAGNOSIS:    Diffuse small to moderate branch OM and diagonal disease would now occlusion of the RCA with left to right  collaterals.  Cardiopathy out of proportion with the extent of CAD as noted above.  Moderate Pulmonary Venous Hypertension  PLAN OF CARE:  The patient is returned to the nursing unit for ongoing post catheterization care.  Continue medication optimization with diuresis and titration of CHF medications.  At this point I do not feel that PCI on any of the potential lesions and would benefit his overall cardiac function or prognosis. Of course it is condition deteriorates, consideration could be made for PTCA of the diagonal and OM branches.   [name], M.D., M.S. [facility] [address] [place], [place]  [address]  [phone]  05/31/2013 2:51 PM

## 2013-06-12 NOTE — Interval H&P Note (Signed)
History and Physical Interval Note:  06/15/2013 12:23 PM  [name]  has presented today for surgery, with the diagnosis of NSTEMI-ACUTE ON CHRONIC DIASTOLIC HF.   The various methods of treatment have been discussed with the patient and family. After consideration of risks, benefits and other options for treatment, the patient has consented to  Procedure(s): LEFT AND RIGHT HEART CATHETERIZATION WITH CORONARY ANGIOGRAM (N/A) +/- PCI  as a surgical intervention .  The patient's history has been reviewed, patient examined, no change in status, stable for surgery.  I have reviewed the patient's chart and labs.  Questions were answered to the patient's satisfaction.     [name]  Cath Lab Visit (complete for each Cath Lab visit)  Clinical Evaluation Leading to the Procedure:   ACS: yes  Non-ACS:    Anginal Classification: CCS IV  Anti-ischemic medical therapy: Maximal Therapy (2 or more classes of medications)  Non-Invasive Test Results: No non-invasive testing performed  Prior CABG: No previous CABG

## 2013-06-12 NOTE — Brief Op Note (Signed)
   BRIEF R&LHC NOTE  06/10/2013 - 06/05/2013  3:03 PM  PATIENT:  [name]  80 y.o. male with known CAD & Ischemic CM presented with CHF & NSTEMI. Referred for R&LHC>  PRE-OPERATIVE DIAGNOSIS: NSTEMI  POST-OPERATIVE DIAGNOSIS:    Diffuse small branch OM & Diagonal disease  ~70-80% & known RCA occlusion & L-R collaterals.  Cardiomyopathy out of proportion with extent of CAD  CO/CI by Fick-TD: 3.1-2.87/1.4-1.29  LVP/LVEDP 107/14/17, PCWP 20  Pulmonary Venous HTN: PAP 63/28/41 mmHg; RVP 63/10/13;  AoP 108/59/76 mmHg  PROCEDURE:  Procedure(s): LEFT AND RIGHT HEART CATHETERIZATION WITH CORONARY ANGIOGRAM (N/A)  SURGEON:  Surgeon(s) and Role:    * [name], MD - Primary  ANESTHESIA:   local and IV sedation; SQ Lidocaine; 25 mg IV Benadryl; 50 mcg Fentanyl  EBL:  <10  PROCEDURE: R Radial 6Fr - TIG 4.0 - Angled Pigtail; RHC: attempted L AC 5 Fr unable to pass beyond ICD --> RFV 7 Fr -  7Fr Swan Cath - Pressures & Sats.   SPECIMEN:  Ao & PA blood for O2Sat%  TR Band:  11 mL  Air / 1450 hrs  RFV & LAC venous sheaths to be removed in PACU Holding  DICTATION: .Note written in [place]: Return to CCU for continued diuresis.  PATIENT DISPOSITION:  PACU - hemodynamically stable.   Restart IV Heparin 6-8 hr post Sheath removal.    [name], M.D., M.S. Interventional Cardiologist   Pager # [phone] 05/20/2013

## 2013-06-12 NOTE — Progress Notes (Signed)
Pt received back from cath lab, right radial with tr band in place with 9 ml of air per rn report. Pt placed on tele monitor no c/o of pain will continue to monitor.

## 2013-06-13 DIAGNOSIS — J4489 Other specified chronic obstructive pulmonary disease: Secondary | ICD-10-CM

## 2013-06-13 DIAGNOSIS — J449 Chronic obstructive pulmonary disease, unspecified: Secondary | ICD-10-CM

## 2013-06-13 LAB — CBC
HCT: 55.2 % — ABNORMAL HIGH (ref 39.0–52.0)
Hemoglobin: 19 g/dL — ABNORMAL HIGH (ref 13.0–17.0)
MCH: 32.1 pg (ref 26.0–34.0)
MCHC: 34.4 g/dL (ref 30.0–36.0)
MCV: 93.2 fL (ref 78.0–100.0)
Platelets: 162 10*3/uL (ref 150–400)
RBC: 5.92 MIL/uL — AB (ref 4.22–5.81)
RDW: 15.7 % — ABNORMAL HIGH (ref 11.5–15.5)
WBC: 14.9 10*3/uL — ABNORMAL HIGH (ref 4.0–10.5)

## 2013-06-13 LAB — BASIC METABOLIC PANEL
BUN: 59 mg/dL — ABNORMAL HIGH (ref 6–23)
CO2: 23 mEq/L (ref 19–32)
Calcium: 9 mg/dL (ref 8.4–10.5)
Chloride: 90 mEq/L — ABNORMAL LOW (ref 96–112)
Creatinine, Ser: 1.87 mg/dL — ABNORMAL HIGH (ref 0.50–1.35)
GFR calc Af Amer: 37 mL/min — ABNORMAL LOW (ref >90)
GFR calc non Af Amer: 32 mL/min — ABNORMAL LOW (ref >90)
Glucose, Bld: 194 mg/dL — ABNORMAL HIGH (ref 70–99)
POTASSIUM: 4.4 meq/L (ref 3.7–5.3)
Sodium: 131 mEq/L — ABNORMAL LOW (ref 137–147)

## 2013-06-13 LAB — GLUCOSE, CAPILLARY
GLUCOSE-CAPILLARY: 157 mg/dL — AB (ref 70–99)
GLUCOSE-CAPILLARY: 189 mg/dL — AB (ref 70–99)
GLUCOSE-CAPILLARY: 237 mg/dL — AB (ref 70–99)
GLUCOSE-CAPILLARY: 237 mg/dL — AB (ref 70–99)
Glucose-Capillary: 221 mg/dL — ABNORMAL HIGH (ref 70–99)
Glucose-Capillary: 268 mg/dL — ABNORMAL HIGH (ref 70–99)

## 2013-06-13 LAB — HEPARIN LEVEL (UNFRACTIONATED): Heparin Unfractionated: 0.22 IU/mL — ABNORMAL LOW (ref 0.30–0.70)

## 2013-06-13 MED ORDER — COLLAGENASE 250 UNIT/GM EX OINT
TOPICAL_OINTMENT | Freq: Every day | CUTANEOUS | Status: DC
  Administered 2013-06-13 – 2013-06-17 (×5): via TOPICAL
  Filled 2013-06-13: qty 30

## 2013-06-13 MED ORDER — AMIODARONE HCL 200 MG PO TABS: 200.0000 mg | ORAL_TABLET | Freq: Two times a day (BID) | ORAL | Status: DC

## 2013-06-13 MED ORDER — BIOTENE DRY MOUTH MT LIQD
15.0000 mL | Freq: Two times a day (BID) | OROMUCOSAL | Status: DC
  Administered 2013-06-13 – 2013-06-17 (×9): 15 mL via OROMUCOSAL

## 2013-06-13 MED ORDER — INSULIN ASPART 100 UNIT/ML ~~LOC~~ SOLN
0.0000 [IU] | Freq: Three times a day (TID) | SUBCUTANEOUS | Status: DC
  Administered 2013-06-13: 5 [IU] via SUBCUTANEOUS
  Administered 2013-06-13: 3 [IU] via SUBCUTANEOUS
  Administered 2013-06-13: 8 [IU] via SUBCUTANEOUS
  Administered 2013-06-13: 5 [IU] via SUBCUTANEOUS
  Administered 2013-06-14: 3 [IU] via SUBCUTANEOUS
  Administered 2013-06-14: 5 [IU] via SUBCUTANEOUS
  Administered 2013-06-14 (×2): 8 [IU] via SUBCUTANEOUS
  Administered 2013-06-15 (×3): 11 [IU] via SUBCUTANEOUS
  Administered 2013-06-15: 8 [IU] via SUBCUTANEOUS

## 2013-06-13 MED ORDER — AMIODARONE HCL 200 MG PO TABS
400.0000 mg | ORAL_TABLET | Freq: Two times a day (BID) | ORAL | Status: DC
  Administered 2013-06-13 (×2): 400 mg via ORAL
  Filled 2013-06-13 (×5): qty 2

## 2013-06-13 MED ORDER — RIVAROXABAN 15 MG PO TABS
15.0000 mg | ORAL_TABLET | Freq: Every day | ORAL | Status: DC
  Administered 2013-06-13 – 2013-06-16 (×4): 15 mg via ORAL
  Filled 2013-06-13 (×5): qty 1

## 2013-06-13 MED ORDER — AMIODARONE HCL 200 MG PO TABS: 400.0000 mg | ORAL_TABLET | Freq: Two times a day (BID) | ORAL | Status: DC

## 2013-06-13 MED FILL — Heparin Sodium (Porcine) 100 Unt/ML in Sodium Chloride 0.45%: INTRAMUSCULAR | Qty: 250 | Status: AC

## 2013-06-13 NOTE — Consult Note (Signed)
WOC wound consult note Reason for Consult: right heel ulcer, however upon my assessment I do not see any problems with either heel they are cool and mildly discolored but blanchable and the patient has bilateral palpable pulses.  He does however have a right lateral malleolus wound Wound type: unclear as patient is poor historian however he does spend a great deal of time in the recliner and his feet tend to rotate laterally. He reports neuropathy also.  May be related to pressure from the lateral rotation and the lack of movement on the patients part in combination with his DM Pressure Ulcer POA: Yes Measurement: 0.5cm x 0.5cm x 0 Wound bed:100% eschar Drainage (amount, consistency, odor) minimal, serosanguinous  Periwound: intact slight area of surrounding erythema but not extending out from wound and no streaking from wound. Dressing procedure/placement/frequency: Will add enzymatic debridement ointment to clean away eschar after wound pink and clean ok to switch to antibiotic ointment and cover dressing.  Discussed POC with patient and bedside nurse.  Re consult if needed, will not follow at this time. Thanks  Calen Posch [place], [place] [phone])

## 2013-06-13 NOTE — Progress Notes (Signed)
SUBJECTIVE:   80 y.o. male with a history of CAD, atrial fibrillation on Xarelto and biventricular ICD admitted with NSTEMI with elevated trop to 14>>13. ECG atrial fibrillation with predominant BiV pacing.    Cath revealed 5/26 Diffuse small to moderate branch OM and diagonal disease with occlusion of the RCA with left to right collaterals.  Cardiopathy out of proportion with the extent of CAD as noted above.  Moderate Pulmonary Venous Hypertension  [facility] 5/26: RA press =13/13, RV 60/10, PA 63/28, PCWP 20/21, LV 107/14. CO/CI 3.11/1.4 by Fick method and 2.87/1.29 by thermodilution.   2 d echo 5/26: EF 30-35%, akinesis of the basal-mid inferior myocardium. PA pressure 36 mmHg.  (2 d echo 07/2011: EF 50-55%)   Still on heparin gtt. Wt is down by 1lb. Reports to be feeling weak. Restarted lasix at 80 mg bid  Tele with a few irregular heart beats episodes    . acetaminophen  500 mg Oral Q12H  . aspirin  81 mg Oral Daily  . atorvastatin  80 mg Oral Daily  . b complex-vitamin c-folic acid  1 tablet Oral QHS  . digoxin  125 mcg Oral QODAY  . DULoxetine  60 mg Oral Q24H  . fluticasone  2 spray Each Nare Daily  . furosemide  80 mg Intravenous BID  . gabapentin  100 mg Oral Q24H  . insulin aspart  0-15 Units Subcutaneous 6 times per day  . isosorbide mononitrate  60 mg Oral Q24H  . levothyroxine  100 mcg Oral QAC breakfast  . loratadine  10 mg Oral Daily  . metoprolol succinate  25 mg Oral Q24H  . morphine  30 mg Oral TID  . pantoprazole  40 mg Oral Daily  . sodium chloride  3 mL Intravenous Q12H  . sodium chloride  3 mL Intravenous Q12H   . amiodarone 30 mg/hr (06/13/13 0518)  . heparin 1,600 Units/hr (05/28/2013 2300)     PHYSICAL EXAM Filed Vitals:   06/13/13 0400 06/13/13 0404 06/13/13 0430 06/13/13 0500  BP: 108/23  113/30 134/77  Pulse: 84  79 87  Temp:  97.8 F (36.6 C)    TempSrc:  Oral    Resp: 11  12 14   Height:      Weight:  220 lb 10.9 oz (100.1 kg)     SpO2: 93%  93% 92%   General: A&OX3. No acute distress.  Neck: No carotid bruits. no JVD Lungs: clear bilaterally,  Heart: IRRegular rate and rhythm with S1 S2. No S3 or S4. 2/6 murmur, no rubs, or gallops appreciated.  Abdomen: Soft, non-tender, non-distended with normoactive bowel sounds. No hepatomegaly. No rebound/guarding. No obvious abdominal masses.  Msk: movies all extremities.  Extremities: No clubbing or cyanosis. No edema. Distal pedal pulses are 2+. Cath site - no complications Skin: No rashes or lesions noted  LABS: Lab Results  Component Value Date   TROPONINI 7.92* 05/19/2013   Results for orders placed during the hospital encounter of 06/03/2013 (from the past 24 hour(s))  GLUCOSE, CAPILLARY     Status: Abnormal   Collection Time    05/28/2013  7:37 AM      Result Value Ref Range   Glucose-Capillary 219 (*) 70 - 99 mg/dL  TROPONIN I     Status: Abnormal   Collection Time    06/13/2013  8:45 AM      Result Value Ref Range   Troponin I 7.92 (*) <0.30 ng/mL  GLUCOSE, CAPILLARY  Status: Abnormal   Collection Time    06/06/2013 11:17 AM      Result Value Ref Range   Glucose-Capillary 227 (*) 70 - 99 mg/dL  GLUCOSE, CAPILLARY     Status: Abnormal   Collection Time    05/21/2013 11:52 AM      Result Value Ref Range   Glucose-Capillary 233 (*) 70 - 99 mg/dL  HEPARIN LEVEL (UNFRACTIONATED)     Status: Abnormal   Collection Time    06/06/2013 12:20 PM      Result Value Ref Range   Heparin Unfractionated 0.21 (*) 0.30 - 0.70 IU/mL  APTT     Status: Abnormal   Collection Time    05/20/2013 12:20 PM      Result Value Ref Range   aPTT 77 (*) 24 - 37 seconds  PROTIME-INR     Status: Abnormal   Collection Time    06/03/2013 12:20 PM      Result Value Ref Range   Prothrombin Time 17.0 (*) 11.6 - 15.2 seconds   INR 1.42  0.00 - 1.49  POCT ACTIVATED CLOTTING TIME     Status: None   Collection Time    05/29/2013  2:23 PM      Result Value Ref Range   Activated Clotting Time 221     POCT I-STAT 3, ART BLOOD GAS (G3+)     Status: Abnormal   Collection Time    05/25/2013  2:24 PM      Result Value Ref Range   pH, Arterial 7.346 (*) 7.350 - 7.450   pCO2 arterial 41.9  35.0 - 45.0 mmHg   pO2, Arterial 70.0 (*) 80.0 - 100.0 mmHg   Bicarbonate 22.9  20.0 - 24.0 mEq/L   TCO2 24  0 - 100 mmol/L   O2 Saturation 93.0     Acid-base deficit 3.0 (*) 0.0 - 2.0 mmol/L   Sample type ARTERIAL    POCT I-STAT 3, ART BLOOD GAS (G3+)     Status: Abnormal   Collection Time    06/02/2013  2:36 PM      Result Value Ref Range   pH, Arterial 7.306 (*) 7.350 - 7.450   pCO2 arterial 53.6 (*) 35.0 - 45.0 mmHg   pO2, Arterial 33.0 (*) 80.0 - 100.0 mmHg   Bicarbonate 26.7 (*) 20.0 - 24.0 mEq/L   TCO2 28  0 - 100 mmol/L   O2 Saturation 57.0     Acid-base deficit 1.0  0.0 - 2.0 mmol/L   Sample type ARTERIAL    GLUCOSE, CAPILLARY     Status: Abnormal   Collection Time    06/06/2013  3:23 PM      Result Value Ref Range   Glucose-Capillary 236 (*) 70 - 99 mg/dL  POCT ACTIVATED CLOTTING TIME     Status: None   Collection Time    05/27/2013  3:24 PM      Result Value Ref Range   Activated Clotting Time 177    GLUCOSE, CAPILLARY     Status: Abnormal   Collection Time    06/15/2013  4:33 PM      Result Value Ref Range   Glucose-Capillary 225 (*) 70 - 99 mg/dL  GLUCOSE, CAPILLARY     Status: Abnormal   Collection Time    05/21/2013  9:05 PM      Result Value Ref Range   Glucose-Capillary 242 (*) 70 - 99 mg/dL  GLUCOSE, CAPILLARY     Status: Abnormal  Collection Time    05/31/2013 11:52 PM      Result Value Ref Range   Glucose-Capillary 221 (*) 70 - 99 mg/dL  GLUCOSE, CAPILLARY     Status: Abnormal   Collection Time    06/13/13  4:09 AM      Result Value Ref Range   Glucose-Capillary 157 (*) 70 - 99 mg/dL    Intake/Output Summary (Last 24 hours) at 06/13/13 7782 Last data filed at 06/13/13 0500  Gross per 24 hour  Intake 1704.21 ml  Output    900 ml  Net 804.21 ml     ASSESSMENT  AND PLAN:  Principal Problem:   NSTEMI (non-ST elevated myocardial infarction) Active Problems:   HYPERLIPIDEMIA-MIXED   Essential hypertension, benign   CARDIOMYOPATHY, ISCHEMIC   COPD   Thrombocytosis   CHF (congestive heart failure)   CAD: NSTEMI. Patient has history of CAD (07/2004 cath with occluded RCA and collaterals). Denies chest pain or SOB today. Trop trending down 14>13>7.92. Cath without acute occlusion but with Diffuse small to moderate branch OM and diagonal disease with occlusion of the RCA with left to right collaterals. Nothing treatable. Low CO/CI noted on [facility] Plan  - cont with Heparin gt and plan to transition back to Xarelto  - ASA, atorvastatin 80 mg daily.  - will concentrate on treatment of heart failure as it appears that leading issue is fluid overload in the setting of Afib    Atrial fibrillation: History of PAF. EKG no afib this am. On home amiodarone. Atrial fibrillation will make his BiV pacing much less efficient and may be contributing to current CHF exacerbation.  Plan  - still on amiodarone gtt. Can switch to oral amiodarone today - Consider DCCV in the future if he remains in afib.  - will switch to Xarelto today - cont with heparin gtt.  - Continue home Toprol XL.   Acute on chronic diastolic CHF: EF 42-35% (improved) in 2013. Worse on repeat echo with EF 30-35%. He is requiring face mask for oxygenation. Noncompliant with home O2 for COPD. Wt has not changed much Plan - Lasix 80 mg IV bid,  - consider addition of small dose of metolazone to augment diuresis - Hold irbesartan with elevated creatinine.  - Continue Toprol XL.  Leukocytosis: likely stress induced. No fevers. Will panculture and consider abx if suscipicion of infection  CKD: Creatinine 1.7. Will need to follow closely with diuresis. Hold irbesartan. Cont with lasix for volume. Today's labs are pending.  COPD: Home oxygen at baseline. Suspect worsening oxygen saturation is due to CHF.   Possible dementia: no formal diagnosis Diabetes: on insulin therapy Chronic back pain: cont with home MS-contin Code status: DNR/DNI.   Discussed with Dr Martinique.   Signed:  Jessee Avers, MD PGY-2 Internal Medicine Teaching Service Pager: [phone] 06/13/2013, 6:38 AM  Patient seen and examined and history reviewed. Agree with above findings and plan. Patient feels well. Very weak. No chest pain. Cath data reviewed from yesterday. Chronic occlusion of RCA. I think his culprit lesion is the first OM which appears to have a moderate to severe stenosis at the ostium with TIMI 2 flow. Currently pain free. Would recommend medical therapy. Still in AFib. Reloading with amiodarone. Will switch to po today. 400 mg bid for 5 days then 400 mg daily. Transition IV heparin to Xarelto. Transfer to stepdown today. Continue IV diuresis and monitor renal function closely. Try and wean oxygen but it sounds like he was chronically hypoxic  at home.  Ander Slade [facility] 06/13/2013 10:39 AM

## 2013-06-13 NOTE — Progress Notes (Signed)
Dr. Martinique rounding on patient. Plan is to wean pt off non-rebreather. MD wants the O2 above 88%.

## 2013-06-13 NOTE — Progress Notes (Signed)
Patient's sat's dropping to the low 80's on Ventimask at 55%. Lung sounds are clear and patient is not in any respiratory distress. Notified Dr. Donneta Romberg with cardiology. Pt placed on NRB to maintain O2 above 88%. Will continue to monitor closely.

## 2013-06-13 NOTE — Progress Notes (Signed)
ANTICOAGULATION CONSULT NOTE - Initial Consult  Pharmacy Consult for transition to Xarelto from heparin gtt Indication: atrial fibrillation  Allergies  Allergen Reactions  . Celecoxib   . Iodine   . Penicillins   . Sulfa Antibiotics   . Zolpidem Tartrate     Patient Measurements: Height: 6' (182.9 cm) Weight: 220 lb 10.9 oz (100.1 kg) IBW/kg (Calculated) : 77.6  Vital Signs: Temp: 97.6 F (36.4 C) (05/27 0700) Temp src: Oral (05/27 0700) BP: 152/39 mmHg (05/27 1000) Pulse Rate: 86 (05/27 1000)  Labs:  Recent Labs  06/04/2013 2215 06/07/2013 0110 05/24/2013 0845 05/26/2013 1220 06/13/13 0800 06/13/13 0926  HGB  --  19.4*  --   --  19.0*  --   HCT  --  57.3*  --   --  55.2*  --   PLT  --  140*  --   --  162  --   APTT  --   --   --  77*  --   --   LABPROT  --   --   --  17.0*  --   --   INR  --   --   --  1.42  --   --   HEPARINUNFRC  --  0.26*  --  0.21*  --  0.22*  CREATININE  --  1.77*  --   --  1.87*  --   TROPONINI 14.56* 13.34* 7.92*  --   --   --     Estimated Creatinine Clearance: 36.7 ml/min (by C-G formula based on Cr of 1.87).   Medical History: Past Medical History  Diagnosis Date  . Chronic systolic heart failure     LVEF 20% up to 50%  . ICD (implantable cardiac defibrillator) in place     RV lead fractured (4401 fidelis lead) and therefore now programmed with VT/VF therapies off  . Ischemic cardiomyopathy   . Atrial fibrillation   . Mitral regurgitation   . Coronary atherosclerosis of native coronary artery     Multiple percutaneous coronary intervention in the past last cath in 07/2004: totally occluded RCA with left to right collaterals ,and a 70-75% mid to distal obtuse marginal stenosis medical therapy  . Essential hypertension, benign   . Hyperlipidemia   . COPD (chronic obstructive pulmonary disease)     O2 at home  . GERD (gastroesophageal reflux disease)   . Type 2 diabetes mellitus   . Sleep apnea   . Anemia   . Dementia      Medications:  Scheduled:  . acetaminophen  500 mg Oral Q12H  . amiodarone  400 mg Oral BID   Followed by  . [START ON 07/10/2013] amiodarone  200 mg Oral BID  . aspirin  81 mg Oral Daily  . atorvastatin  80 mg Oral Daily  . b complex-vitamin c-folic acid  1 tablet Oral QHS  . collagenase   Topical Daily  . digoxin  125 mcg Oral QODAY  . DULoxetine  60 mg Oral Q24H  . fluticasone  2 spray Each Nare Daily  . furosemide  80 mg Intravenous BID  . gabapentin  100 mg Oral Q24H  . insulin aspart  0-15 Units Subcutaneous TID WC & HS  . isosorbide mononitrate  60 mg Oral Q24H  . levothyroxine  100 mcg Oral QAC breakfast  . loratadine  10 mg Oral Daily  . metoprolol succinate  25 mg Oral Q24H  . morphine  30 mg Oral TID  .  pantoprazole  40 mg Oral Daily  . rivaroxaban  15 mg Oral Q supper  . sodium chloride  3 mL Intravenous Q12H  . sodium chloride  3 mL Intravenous Q12H   Infusions:  . heparin 1,600 Units/hr (06/13/13 0800)    Assessment: 80 yo M (on PTA Xarelto for afib) originally presented with shortness of breath and weakness to [facility] where heparin IV gtt was started for ACS and elevated troponins. Heparin was continued at [facility] for NSTEMI, but now patient is s/p cath and pharmacy consulted to transition patient back to Xarelto PO.  This morning's scheduled 0700 heparin level was delayed d/t recollection because of need for appropriate collection tube for patient with high Hgb.  It has now resulted subtherapeutic at 0.22.  Hgb is stable at 19, and plt are trending up to 162. SCr is up to 1.87 with CrCl ~38. No bleeding complications noted.    Goal of Therapy:  Anticoagulation with adherence, stroke prevention Monitor platelets by anticoagulation protocol: Yes   Plan:  - no need to adjust heparin gtt for subtherapeutic level prior to stopping, will go ahead and initiate Xarelto PO - discontinue heparin IV gtt at ~1130, and give first dose of Xarelto 15mg  daily,  starting tomorrow subsequent doses to be given with supper - monitor kidney function and possible need to adjust dose if improved - monitor for s/s of bleeding  Ovid Curd E. Jacqlyn Larsen, PharmD Clinical Pharmacist - Resident Pager: [phone] Pharmacy: [phone] 06/13/2013 11:07 AM

## 2013-06-13 NOTE — Clinical Documentation Improvement (Signed)
Documentation Clarification #1:    Possible Clinical Conditions?    _______CKD Stage I - GFR > OR = 90 _______CKD Stage II - GFR 60-80 _______CKD Stage III - GFR 30-59 _______CKD Stage IV - GFR 15-29 _______CKD Stage V - GFR < 15 _______ESRD (End Stage Renal Disease) _______Other condition _______Cannot Clinically determine     Risk Factors:  CKD, creat 1.7, will need to follow closely with diuresis.  Labs: 5/27:  Bun: 43.        Creat: 1.77.         GFR: 34.  ________________________________________________________________________________________________  Documentation Clarification #2:    Possible Clinical Conditions?  ____Hyponatremia ____Other Condition ____Cannot Clinically determine    Labs: 5/26:  Sodium: 129.  Treatment: 0.9% NaCl infusion @ 1mg /kg/hr.   Thank You, Theron Arista, Clinical Documentation Specialist:  [phone]  [place] [facility]

## 2013-06-14 ENCOUNTER — Inpatient Hospital Stay (HOSPITAL_COMMUNITY): Payer: Medicare Other

## 2013-06-14 DIAGNOSIS — R0902 Hypoxemia: Secondary | ICD-10-CM | POA: Diagnosis present

## 2013-06-14 LAB — BLOOD GAS, ARTERIAL
ACID-BASE DEFICIT: 3.6 mmol/L — AB (ref 0.0–2.0)
Bicarbonate: 21.1 mEq/L (ref 20.0–24.0)
Drawn by: 313941
FIO2: 1 %
O2 Saturation: 93.7 %
Patient temperature: 98.6
TCO2: 22.3 mmol/L (ref 0–100)
pCO2 arterial: 39.1 mmHg (ref 35.0–45.0)
pH, Arterial: 7.351 (ref 7.350–7.450)
pO2, Arterial: 77.5 mmHg — ABNORMAL LOW (ref 80.0–100.0)

## 2013-06-14 LAB — URINALYSIS, ROUTINE W REFLEX MICROSCOPIC
Bilirubin Urine: NEGATIVE
Glucose, UA: NEGATIVE mg/dL
HGB URINE DIPSTICK: NEGATIVE
Ketones, ur: NEGATIVE mg/dL
LEUKOCYTES UA: NEGATIVE
Nitrite: NEGATIVE
PH: 5 (ref 5.0–8.0)
PROTEIN: NEGATIVE mg/dL
Specific Gravity, Urine: 1.021 (ref 1.005–1.030)
Urobilinogen, UA: 1 mg/dL (ref 0.0–1.0)

## 2013-06-14 LAB — CBC
HCT: 51 % (ref 39.0–52.0)
Hemoglobin: 18.1 g/dL — ABNORMAL HIGH (ref 13.0–17.0)
MCH: 32.4 pg (ref 26.0–34.0)
MCHC: 35.5 g/dL (ref 30.0–36.0)
MCV: 91.2 fL (ref 78.0–100.0)
Platelets: 170 10*3/uL (ref 150–400)
RBC: 5.59 MIL/uL (ref 4.22–5.81)
RDW: 15.3 % (ref 11.5–15.5)
WBC: 12.8 10*3/uL — ABNORMAL HIGH (ref 4.0–10.5)

## 2013-06-14 LAB — GLUCOSE, CAPILLARY
GLUCOSE-CAPILLARY: 236 mg/dL — AB (ref 70–99)
GLUCOSE-CAPILLARY: 196 mg/dL — AB (ref 70–99)
Glucose-Capillary: 236 mg/dL — ABNORMAL HIGH (ref 70–99)
Glucose-Capillary: 292 mg/dL — ABNORMAL HIGH (ref 70–99)

## 2013-06-14 LAB — BASIC METABOLIC PANEL
BUN: 79 mg/dL — ABNORMAL HIGH (ref 6–23)
BUN: 86 mg/dL — AB (ref 6–23)
CALCIUM: 8.9 mg/dL (ref 8.4–10.5)
CHLORIDE: 83 meq/L — AB (ref 96–112)
CO2: 20 mEq/L (ref 19–32)
CO2: 22 meq/L (ref 19–32)
CREATININE: 2.62 mg/dL — AB (ref 0.50–1.35)
CREATININE: 2.85 mg/dL — AB (ref 0.50–1.35)
Calcium: 9.2 mg/dL (ref 8.4–10.5)
Chloride: 84 mEq/L — ABNORMAL LOW (ref 96–112)
GFR calc Af Amer: 24 mL/min — ABNORMAL LOW (ref >90)
GFR calc non Af Amer: 21 mL/min — ABNORMAL LOW (ref >90)
GFR calc non Af Amer: 19 mL/min — ABNORMAL LOW (ref >90)
GFR, EST AFRICAN AMERICAN: 22 mL/min — AB (ref >90)
GLUCOSE: 200 mg/dL — AB (ref 70–99)
GLUCOSE: 228 mg/dL — AB (ref 70–99)
POTASSIUM: 5.2 meq/L (ref 3.7–5.3)
Potassium: 4.9 mEq/L (ref 3.7–5.3)
Sodium: 122 mEq/L — ABNORMAL LOW (ref 137–147)
Sodium: 122 mEq/L — ABNORMAL LOW (ref 137–147)

## 2013-06-14 LAB — SEDIMENTATION RATE: Sed Rate: 10 mm/hr (ref 0–16)

## 2013-06-14 LAB — SODIUM, URINE, RANDOM: Sodium, Ur: <20 mEq/L

## 2013-06-14 MED ORDER — IPRATROPIUM-ALBUTEROL 0.5-2.5 (3) MG/3ML IN SOLN: 3.0000 mL | RESPIRATORY_TRACT | Status: DC

## 2013-06-14 MED ORDER — METHYLPREDNISOLONE SODIUM SUCC 125 MG IJ SOLR
60.0000 mg | Freq: Two times a day (BID) | INTRAMUSCULAR | Status: DC
  Administered 2013-06-14 – 2013-06-15 (×3): 60 mg via INTRAVENOUS
  Filled 2013-06-14: qty 2
  Filled 2013-06-14: qty 0.96
  Filled 2013-06-14: qty 2
  Filled 2013-06-14: qty 0.96

## 2013-06-14 MED ORDER — IPRATROPIUM-ALBUTEROL 0.5-2.5 (3) MG/3ML IN SOLN
3.0000 mL | Freq: Four times a day (QID) | RESPIRATORY_TRACT | Status: DC
  Administered 2013-06-14 – 2013-06-15 (×4): 3 mL via RESPIRATORY_TRACT
  Filled 2013-06-14 (×4): qty 3

## 2013-06-14 MED ORDER — INSULIN GLARGINE 100 UNIT/ML ~~LOC~~ SOLN
10.0000 [IU] | Freq: Every day | SUBCUTANEOUS | Status: DC
  Administered 2013-06-14: 10 [IU] via SUBCUTANEOUS
  Filled 2013-06-14 (×2): qty 0.1

## 2013-06-14 NOTE — Progress Notes (Signed)
SUBJECTIVE:   80 y.o. male with a history of CAD, atrial fibrillation on Xarelto and biventricular ICD admitted with NSTEMI with elevated trop to 14>>13. ECG atrial fibrillation with predominant BiV pacing.    Cath revealed 5/26 Diffuse small to moderate branch OM and diagonal disease with occlusion of the RCA with left to right collaterals.  Cardiopathy out of proportion with the extent of CAD as noted above.  Moderate Pulmonary Venous Hypertension  [facility] 5/26: RA press =13/13, RV 60/10, PA 63/28, PCWP 20/21, LV 107/14. CO/CI 3.11/1.4 by Fick method and 2.87/1.29 by thermodilution.   2 d echo 5/26: EF 30-35%, akinesis of the basal-mid inferior myocardium. PA pressure 36 mmHg.  (2 d echo 07/2011: EF 50-55%)   Off heparin, transitioned to Xarelto yesterday and loaded with amiodarone po. Continues with IV lasix at 80 mg bid. Wt unchanged.   Has high O2 demands and requires NRB. Low urine output.   Medications: . acetaminophen  500 mg Oral Q12H  . amiodarone  400 mg Oral BID   Followed by  . [START ON 06/26/2013] amiodarone  200 mg Oral BID  . antiseptic oral rinse  15 mL Mouth Rinse BID  . aspirin  81 mg Oral Daily  . atorvastatin  80 mg Oral Daily  . b complex-vitamin c-folic acid  1 tablet Oral QHS  . collagenase   Topical Daily  . digoxin  125 mcg Oral QODAY  . DULoxetine  60 mg Oral Q24H  . fluticasone  2 spray Each Nare Daily  . furosemide  80 mg Intravenous BID  . gabapentin  100 mg Oral Q24H  . insulin aspart  0-15 Units Subcutaneous TID WC & HS  . isosorbide mononitrate  60 mg Oral Q24H  . levothyroxine  100 mcg Oral QAC breakfast  . loratadine  10 mg Oral Daily  . metoprolol succinate  25 mg Oral Q24H  . morphine  30 mg Oral TID  . pantoprazole  40 mg Oral Daily  . rivaroxaban  15 mg Oral Q supper  . sodium chloride  3 mL Intravenous Q12H  . sodium chloride  3 mL Intravenous Q12H     PHYSICAL EXAM Filed Vitals:   06/13/13 2300 06/14/13 0000 06/14/13 0300  06/14/13 0738  BP: 129/62  144/72 115/52  Pulse: 81 89 83 82  Temp: 97 F (36.1 C)  97.1 F (36.2 C) 97.5 F (36.4 C)  TempSrc: Axillary  Axillary Axillary  Resp: 14 12 11 18   Height:      Weight:   221 lb 5.5 oz (100.4 kg)   SpO2: 83% 89% 91% 92%   General: A&OX3. No acute distress.  Neck: No carotid bruits. no JVD Lungs: clear bilaterally,  Heart: IRRegular rate and rhythm with S1 S2. No S3 or S4. 2/6 murmur, no rubs, or gallops appreciated.  Abdomen: Soft, non-tender, non-distended with normoactive bowel sounds. No hepatomegaly. No rebound/guarding. No obvious abdominal masses.  Msk: movies all extremities.  Extremities: No clubbing or cyanosis. No edema. Distal pedal pulses are 2+. Cath site - no complications Skin: No rashes or lesions noted  LABS: Lab Results  Component Value Date   TROPONINI 7.92* 2013-07-02   Results for orders placed during the hospital encounter of 05/30/2013 (from the past 24 hour(s))  HEPARIN LEVEL (UNFRACTIONATED)     Status: Abnormal   Collection Time    06/13/13  9:26 AM      Result Value Ref Range   Heparin Unfractionated 0.22 (*)  0.30 - 0.70 IU/mL  GLUCOSE, CAPILLARY     Status: Abnormal   Collection Time    06/13/13 11:30 AM      Result Value Ref Range   Glucose-Capillary 237 (*) 70 - 99 mg/dL  GLUCOSE, CAPILLARY     Status: Abnormal   Collection Time    06/13/13  4:25 PM      Result Value Ref Range   Glucose-Capillary 268 (*) 70 - 99 mg/dL  GLUCOSE, CAPILLARY     Status: Abnormal   Collection Time    06/13/13  9:25 PM      Result Value Ref Range   Glucose-Capillary 237 (*) 70 - 99 mg/dL  CBC     Status: Abnormal   Collection Time    06/14/13  2:56 AM      Result Value Ref Range   WBC 12.8 (*) 4.0 - 10.5 K/uL   RBC 5.59  4.22 - 5.81 MIL/uL   Hemoglobin 18.1 (*) 13.0 - 17.0 g/dL   HCT 51.0  39.0 - 52.0 %   MCV 91.2  78.0 - 100.0 fL   MCH 32.4  26.0 - 34.0 pg   MCHC 35.5  30.0 - 36.0 g/dL   RDW 15.3  11.5 - 15.5 %   Platelets  170  150 - 400 K/uL    Intake/Output Summary (Last 24 hours) at 06/14/13 0802 Last data filed at 06/13/13 2045  Gross per 24 hour  Intake  367.4 ml  Output    725 ml  Net -357.6 ml     ASSESSMENT AND PLAN:  Principal Problem:   NSTEMI (non-ST elevated myocardial infarction) Active Problems:   HYPERLIPIDEMIA-MIXED   Essential hypertension, benign   CARDIOMYOPATHY, ISCHEMIC   COPD   Thrombocytosis   CHF (congestive heart failure)   Hypoxia   CAD: NSTEMI. Patient has history of CAD (07/2004 cath with occluded RCA and collaterals). Likely culprit lesion is the first OM which appears to have a moderate to severe stenosis at the ostium with TIMI 2 flow. No intervention was performed. Trop trending down 14>13>7.92. Low CO/CI noted on [facility]. Plan  - restarted back on home xarelto  - ASA, atorvastatin 80 mg daily.  - no chest pain   Atrial fibrillation: History of PAF. EKG no afib this am. On home amiodarone. Atrial fibrillation will make his BiV pacing much less efficient and may be contributing to current CHF exacerbation.  Telemetry with cont irregular HR. Plan  - on po amiodarone loading with 400 mg bid for 5 days and then 200mg /day  - Consider DCCV in the future if he remains in afib.  - off heparin gtt 5/27 and started on home xarelto .  - Continue home Toprol XL.   Acute on chronic diastolic CHF: EF 33-82% (improved) in 2013. Worse on repeat echo with EF 30-35%. He is requiring face mask for oxygenation for high oxygen demands. Noncompliant with home O2 for COPD. Wt has not changed much Plan - Lasix 80 mg IV bid but with poor response. -monitor electrolytes closely  - Hold irbesartan with elevated creatinine.  - Continue Toprol XL.  Leukocytosis: likely stress induced. No fevers. Will panculture and consider abx if suscipicion of infection   CKD: Creatinine 1.7. Will need to follow closely with diuresis. Hold irbesartan. Cont with lasix for volume offload. Today's labs are  pending.  COPD: reports that he is chronically hypoxic. Home oxygen at baseline. Suspect worsening oxygen saturation is due to CHF. Will supplement O2  with goal sats of 88-92%. ? Amiodarone pulmonary toxicity. PE is less likely as patient is on oral anticougulants chronically. CTA chest can be done with poor renal function. Will start duonebs and consult pulm. Possible dementia: no formal diagnosis Diabetes: on insulin therapy Chronic back pain: cont with home MS-contin Code status: DNR/DNI.   Discussed with Dr Martinique.   Signed:  Jessee Avers, MD PGY-2 Internal Medicine Teaching Service Pager: [phone] 06/14/2013, 8:02 AM   Patient seen and examined and history reviewed. Agree with above findings and plan. Patient still has a high oxygen requirement despite diuresis. According to his wife his oxygen levels at home were in the 80s and he would not wear his oxygen consistently. I am concerned that there is significant pulmonary pathology. Possible amiodarone toxicity. Carries diagnosis of COPD but no apparent evaluation in the past. Will stop amiodarone. Not a candidate for other antiarrhythmic drugs at this point so will treat with rate control and anticoagulation. Will ask pulmonary to see in consultation. I am concerned about doing a CT of the chest with contrast with rising creatinine.   Ander Slade [facility] 06/14/2013 10:15 AM

## 2013-06-14 NOTE — Evaluation (Signed)
Physical Therapy Evaluation Patient Details Name: [name] MRN: [id number] DOB: [birth year] Today's Date: 06/14/2013   History of Present Illness  Pt is an 80 y/o male admitted s/p NSTEMI and respiratory failure.   Clinical Impression  Pt admitted with the above. Pt currently with functional limitations due to the deficits listed below (see PT Problem List). At the time of PT eval pt was able to perform standing at EOB but was not able to ambulate in room. Pt will benefit from skilled PT to increase their independence and safety with mobility to allow discharge to the venue listed below.      Follow Up Recommendations Supervision/Assistance - 24 hour;CIR    Equipment Recommendations  Rolling walker with 5" wheels;3in1 (PT)    Recommendations for Other Services  Rehab consult    Precautions / Restrictions Precautions Precautions: Fall Restrictions Weight Bearing Restrictions: No      Mobility  Bed Mobility Overal bed mobility: Needs Assistance;+2 for physical assistance Bed Mobility: Supine to Sit;Sit to Supine     Supine to sit: Max assist;+2 for physical assistance Sit to supine: Total assist;+2 for physical assistance   General bed mobility comments: VC's for sequencing and technique. Pt able to move LE's off bed and assist to elevate trunk into full sitting position.   Transfers Overall transfer level: Needs assistance Equipment used: Rolling walker (2 wheeled) Transfers: Sit to/from Stand Sit to Stand: Mod assist;+2 physical assistance         General transfer comment: VC's for hand placement on seated surface for safety. Pt was able to maintain standing for ~15 seconds.   Ambulation/Gait                Stairs            Wheelchair Mobility    Modified Rankin (Stroke Patients Only)       Balance Overall balance assessment: Needs assistance Sitting-balance support: Feet supported;Bilateral upper extremity supported Sitting  balance-Leahy Scale: Poor   Postural control: Posterior lean;Right lateral lean Standing balance support: Bilateral upper extremity supported Standing balance-Leahy Scale: Poor                               Pertinent Vitals/Pain Pt on non-rebreather mask throughout session and sats dropped to low-mid 80's with activity.     Home Living Family/patient expects to be discharged to:: Private residence Living Arrangements: Spouse/significant other Available Help at Discharge: Family;Available 24 hours/day Type of Home: House Home Access: Stairs to enter Entrance Stairs-Rails: Psychiatric nurse of Steps: 3 Home Layout: One level Home Equipment: Walker - 4 wheels;Shower seat;Cane - single point      Prior Function Level of Independence: Needs assistance   Gait / Transfers Assistance Needed: Wife assisting pt around house minimally as she is not able to provide physical assist but pt using walker all the time  ADL's / Homemaking Assistance Needed: Assist from wife        Hand Dominance   Dominant Hand: Right    Extremity/Trunk Assessment   Upper Extremity Assessment: Defer to OT evaluation           Lower Extremity Assessment: Generalized weakness      Cervical / Trunk Assessment: Kyphotic  Communication   Communication: HOH  Cognition Arousal/Alertness: Awake/alert Behavior During Therapy: WFL for tasks assessed/performed Overall Cognitive Status: Within Functional Limits for tasks assessed  General Comments      Exercises        Assessment/Plan    PT Assessment Patient needs continued PT services  PT Diagnosis Difficulty walking;Generalized weakness   PT Problem List Decreased strength;Decreased range of motion;Decreased activity tolerance;Decreased balance;Decreased mobility;Decreased knowledge of use of DME;Decreased safety awareness;Decreased knowledge of precautions  PT Treatment Interventions  DME instruction;Gait training;Stair training;Functional mobility training;Therapeutic activities;Therapeutic exercise;Neuromuscular re-education;Patient/family education   PT Goals (Current goals can be found in the Care Plan section) Acute Rehab PT Goals Patient Stated Goal: To return home PT Goal Formulation: With patient/family Time For Goal Achievement: 06/28/13 Potential to Achieve Goals: Fair    Frequency Min 3X/week   Barriers to discharge        Co-evaluation               End of Session Equipment Utilized During Treatment: Gait belt;Oxygen Activity Tolerance: Patient limited by fatigue Patient left: in bed;with bed alarm set;with call bell/phone within reach;with family/visitor present Nurse Communication: Mobility status         Time: 5176-1607 PT Time Calculation (min): 24 min   Charges:   PT Evaluation $Initial PT Evaluation Tier I: 1 Procedure     PT G CodesJolyn Lent 06/14/2013, 3:03 PM  Jolyn Lent, PT, DPT Acute Rehabilitation Services Pager: [phone]

## 2013-06-14 NOTE — Consult Note (Signed)
PULMONARY / CRITICAL CARE MEDICINE   Name: [name] MRN: [id number] DOB: 03-Apr-1927    ADMISSION DATE:  05/22/2013 CONSULTATION DATE:  06/14/13  REFERRING MD :  Martinique  PRIMARY SERVICE: Cards   CHIEF COMPLAINT:  Hypoxic resp failure   BRIEF PATIENT DESCRIPTION: 80yo male with hx sCHF, CAD, Afib on amiodarone and xarelto, ?hx COPD admitted 5/25 with increasing SOB, NSTEMI with troponin 14.  Underwent cardiac cath 5/26 showing cardiomyopathy, RCA occlusion, pulm HTN (PAP 63).  Continued to have respiratory distress, significant hypoxia with apparent worsening of chronic interstitial pulm infiltrates and PCCM consulted.   SIGNIFICANT EVENTS / STUDIES:  R and L heart cath 5/25>>> cardiomyopathy out of proportion to CAD, known RCA occlusion, diffuse small branch OM and diagonal disease ~70-80%, Pulm HTN PAP 63 2 d echo 5/26>>> EF 30-35%, akinesis of the basal-mid inferior myocardium. PA pressure 36 mmHg.    LINES / TUBES:   CULTURES:   ANTIBIOTICS:   HISTORY OF PRESENT ILLNESS:  80yo male with hx CAD, Afib on amiodarone and xarelto, ?hx COPD admitted 5/25 with increasing SOB, NSTEMI with troponin 14.  Underwent cardiac cath 5/26 showing cardiomyopathy, RCA occlusion, pulm HTN (PAP 63).  Continued to have respiratory distress, significant hypoxia with apparent worsening of chronic interstitial pulm infiltrates and PCCM consulted.  Denies chest pain currently, SOB, cough, fevers, headache, n/v/d, night sweats, leg/calf pain, syncope. Home O2 sats low at baseline . No hemoptyisis  PAST MEDICAL HISTORY :  Past Medical History  Diagnosis Date  . Chronic systolic heart failure     LVEF 20% up to 50%  . ICD (implantable cardiac defibrillator) in place     RV lead fractured (3474 fidelis lead) and therefore now programmed with VT/VF therapies off  . Ischemic cardiomyopathy   . Atrial fibrillation   . Mitral regurgitation   . Coronary atherosclerosis of native coronary artery      Multiple percutaneous coronary intervention in the past last cath in 07/2004: totally occluded RCA with left to right collaterals ,and a 70-75% mid to distal obtuse marginal stenosis medical therapy  . Essential hypertension, benign   . Hyperlipidemia   . COPD (chronic obstructive pulmonary disease)     O2 at home  . GERD (gastroesophageal reflux disease)   . Type 2 diabetes mellitus   . Sleep apnea   . Anemia   . Dementia    Past Surgical History  Procedure Laterality Date  . Insert / replace / remove pacemaker      Medtronic insync sentry 7299-09/04/2004  . Cholecystectomy    . Cystoscopy      Bladder cancer years sgo  . Cardiac catheterization  2006    RCA subtotaled ISR, LAD non-obs dz, Diag s/p PTCA, OK; OM1 70%  . Coronary angioplasty with stent placement  1997    Stent to the RCA   Prior to Admission medications   Medication Sig Start Date End Date Taking? Authorizing Provider  amiodarone (PACERONE) 200 MG tablet Take 0.5 tablets (100 mg total) by mouth daily. 04/03/13  Yes Satira Sark, MD  atorvastatin (LIPITOR) 10 MG tablet Take 10 mg by mouth daily.     Yes Historical Provider, MD  B Complex-C-Folic Acid (RENAL MULTIVITAMIN FORMULA PO) Take 1 capsule by mouth daily.     Yes Historical Provider, MD  diazepam (VALIUM) 5 MG tablet Take 5 mg by mouth every 8 (eight) hours as needed.    Yes Historical Provider, MD  digoxin (LANOXIN) 0.125  MG tablet Take 125 mcg by mouth every other day.    Yes Historical Provider, MD  DULoxetine (CYMBALTA) 60 MG capsule Take 60 mg by mouth daily.     Yes Historical Provider, MD  ergocalciferol (VITAMIN D2) 50000 UNITS capsule Take 50,000 Units by mouth every Monday.    Yes Historical Provider, MD  fluticasone (FLONASE) 50 MCG/ACT nasal spray 2 sprays by Nasal route daily.     Yes Historical Provider, MD  furosemide (LASIX) 80 MG tablet Take 120 mg by mouth 2 (two) times daily.    Yes Historical Provider, MD  gabapentin (NEURONTIN) 100 MG  capsule Take 100 mg by mouth daily.     Yes Historical Provider, MD  insulin NPH (HUMULIN N) 100 UNIT/ML injection Inject 55 Units into the skin 2 (two) times daily.    Yes Historical Provider, MD  insulin regular (HUMULIN R,NOVOLIN R) 100 UNIT/ML injection Inject into the skin daily. 7-10 units bid   Yes Historical Provider, MD  irbesartan (AVAPRO) 150 MG tablet Take 150 mg by mouth at bedtime.     Yes Historical Provider, MD  isosorbide mononitrate (IMDUR) 60 MG 24 hr tablet Take 1 tablet (60 mg total) by mouth daily. 04/03/13  Yes Satira Sark, MD  levothyroxine (SYNTHROID, LEVOTHROID) 100 MCG tablet Take 100 mcg by mouth daily.     Yes Historical Provider, MD  loratadine (CLARITIN) 10 MG tablet Take 10 mg by mouth daily.     Yes Historical Provider, MD  metoprolol succinate (TOPROL-XL) 25 MG 24 hr tablet TAKE 1 TABLET BY MOUTH TWICE DAILY 06/09/12  Yes Satira Sark, MD  morphine (MS CONTIN) 30 MG 12 hr tablet Take 30 mg by mouth 3 (three) times daily. Takes at 0800, 1600, 2100   Yes Historical Provider, MD  omeprazole (PRILOSEC) 20 MG capsule Take 20 mg by mouth daily.     Yes Historical Provider, MD  Rivaroxaban (XARELTO) 15 MG TABS tablet Take 15 mg by mouth daily.   Yes Historical Provider, MD   Allergies  Allergen Reactions  . Celecoxib   . Iodine   . Penicillins   . Sulfa Antibiotics   . Zolpidem Tartrate     FAMILY HISTORY:  No family history on file. SOCIAL HISTORY:  reports that he quit smoking about 19 years ago. His smoking use included Cigarettes. He has a 150 pack-year smoking history. He has never used smokeless tobacco. He reports that he does not drink alcohol or use illicit drugs.  REVIEW OF SYSTEMS:  As per HPI - All other systems reviewed and were neg.    VITAL SIGNS: Temp:  [97 F (36.1 C)-98 F (36.7 C)] 97.5 F (36.4 C) (05/28 0738) Pulse Rate:  [79-97] 82 (05/28 0738) Resp:  [10-18] 18 (05/28 0738) BP: (115-159)/(41-133) 115/52 mmHg (05/28  0738) SpO2:  [83 %-94 %] 92 % (05/28 0738) FiO2 (%):  [55 %-100 %] 100 % (05/28 0300) Weight:  [221 lb 5.5 oz (100.4 kg)] 221 lb 5.5 oz (100.4 kg) (05/28 0300) HEMODYNAMICS:   VENTILATOR SETTINGS: Vent Mode:  [-]  FiO2 (%):  [55 %-100 %] 100 % INTAKE / OUTPUT: Intake/Output     05/27 0701 - 05/28 0700 05/28 0701 - 05/29 0700   P.O. 240    I.V. (mL/kg) 164.1 (1.6)    Other 6    Total Intake(mL/kg) 410.1 (4.1)    Urine (mL/kg/hr) 725 (0.3)    Total Output 725     Net -314.9  PHYSICAL EXAMINATION: General:  Chronically ill appearing male, NAD  Neuro:  Awake, alert, appropriate  HEENT:  Mm very dry, no edema  Cardiovascular:  s1s2 irreg  Lungs:  resps even, non labored on NRB, essentially clear, no crackles  Abdomen:  Soft, +bs  Musculoskeletal:  Warm and dry, no BLE edema    LABS:  CBC  Recent Labs Lab 05/27/2013 0110 06/13/13 0800 06/14/13 0256  WBC 19.4* 14.9* 12.8*  HGB 19.4* 19.0* 18.1*  HCT 57.3* 55.2* 51.0  PLT 140* 162 170   Coag's  Recent Labs Lab 06/14/2013 1220  APTT 77*  INR 1.42   BMET  Recent Labs Lab 06/01/2013 0110 06/13/13 0800  NA 129* 131*  K 5.3 4.4  CL 91* 90*  CO2 21 23  BUN 43* 59*  CREATININE 1.77* 1.87*  GLUCOSE 183* 194*   Electrolytes  Recent Labs Lab 05/23/2013 0110 06/13/13 0800  CALCIUM 9.2 9.0  MG 2.1  --    Sepsis Markers No results found for this basename: LATICACIDVEN, PROCALCITON, O2SATVEN,  in the last 168 hours ABG  Recent Labs Lab 05/22/2013 1424 06/16/2013 1436  PHART 7.346* 7.306*  PCO2ART 41.9 53.6*  PO2ART 70.0* 33.0*   Liver Enzymes  Recent Labs Lab 06/13/2013 0110  AST 95*  ALT 26  ALKPHOS 64  BILITOT 0.9  ALBUMIN 2.6*   Cardiac Enzymes  Recent Labs Lab 06/09/2013 2215 05/28/2013 0110 06/14/2013 0845  TROPONINI 14.56* 13.34* 7.92*   Glucose  Recent Labs Lab 06/13/13 0409 06/13/13 0740 06/13/13 1130 06/13/13 1625 06/13/13 2125 06/14/13 0740  GLUCAP 157* 189* 237* 268*  237* 236*    Imaging No results found.    ASSESSMENT / PLAN:  PULMONARY Acute hypoxic resp failure - with significant hypoxia.  Low clinical suspicion for PE on oral anticoagulants.  ?amiodarone toxicity  ?Pulm edema doubt - looks clinically dry   ?hx COPD - no documentation/PFT's - chronically on O2 per wife.  P:   Stat CXR  O2 as needed to sat greater 90% Solumedrol 70m IV q12, empiric steroids for amio tox as on 100% no room for declines ESR duonebs  D/c amiodarone as below  CT chest now BLE doppler, if neg with low clinical suspicion then unlikely PE May need swan  CARDIOVASCULAR CAD/ NSTEMI  pulm HTN AFib  P:  D/c amiodarone  Asa, statin, B blocker per cards  Hold further lasix for now  Consider CVL for CVP v PA cath- hold for now on xarelto, pending workup this am and evaluation CT chest   RENAL Acute on CKD ( baseline Scr ~1.4) , r/o pre renal state Hyponatremia  P:   Hold lasix as above  F/u chem  asses ua, urine osm, na, interpret best can as lasix has been given  GASTROINTESTINAL No active issue  P:   PPI   HEMATOLOGIC Chronic anticoagulation  P:  F/u cbc  Pharmacy dosing xarelto with worsening renal function   INFECTIOUS Leukocytosis - likely stress related. Afebrile.  P:   Cultures pending  Hold abx  Monitor fever curve  ENDOCRINE DM P:   SSI Add lantus 5/28 with addition steroids and climbing blood sugars   NEUROLOGIC No active issue  P:   Monitor mental status with worsening hypoxia   KNickolas Madrid NP 06/14/2013  10:54 AM Pager: [phone] or [phone]  I have personally obtained a history, examined the patient, evaluated laboratory and imaging results, formulated the assessment and plan and placed orders.  Lavon Paganini. Titus Mould, MD, [place] Pgr: [facility]

## 2013-06-15 ENCOUNTER — Inpatient Hospital Stay (HOSPITAL_COMMUNITY): Payer: Medicare Other

## 2013-06-15 ENCOUNTER — Encounter (HOSPITAL_COMMUNITY): Admission: AD | Disposition: E | Payer: Medicare Other | Source: Other Acute Inpatient Hospital | Attending: Cardiology

## 2013-06-15 DIAGNOSIS — N189 Chronic kidney disease, unspecified: Secondary | ICD-10-CM

## 2013-06-15 DIAGNOSIS — J9600 Acute respiratory failure, unspecified whether with hypoxia or hypercapnia: Secondary | ICD-10-CM

## 2013-06-15 DIAGNOSIS — I279 Pulmonary heart disease, unspecified: Secondary | ICD-10-CM

## 2013-06-15 DIAGNOSIS — E871 Hypo-osmolality and hyponatremia: Secondary | ICD-10-CM | POA: Diagnosis present

## 2013-06-15 DIAGNOSIS — R160 Hepatomegaly, not elsewhere classified: Secondary | ICD-10-CM

## 2013-06-15 DIAGNOSIS — N179 Acute kidney failure, unspecified: Secondary | ICD-10-CM | POA: Diagnosis present

## 2013-06-15 DIAGNOSIS — R0902 Hypoxemia: Secondary | ICD-10-CM

## 2013-06-15 DIAGNOSIS — R570 Cardiogenic shock: Secondary | ICD-10-CM

## 2013-06-15 HISTORY — PX: RIGHT HEART CATHETERIZATION: SHX5447

## 2013-06-15 LAB — POCT I-STAT 3, VENOUS BLOOD GAS (G3P V)
ACID-BASE DEFICIT: 4 mmol/L — AB (ref 0.0–2.0)
Acid-base deficit: 4 mmol/L — ABNORMAL HIGH (ref 0.0–2.0)
Bicarbonate: 25.3 mEq/L — ABNORMAL HIGH (ref 20.0–24.0)
Bicarbonate: 25.9 mEq/L — ABNORMAL HIGH (ref 20.0–24.0)
O2 SAT: 69 %
O2 Saturation: 70 %
PCO2 VEN: 60.8 mmHg — AB (ref 45.0–50.0)
PH VEN: 7.24 — AB (ref 7.250–7.300)
PO2 VEN: 43 mmHg (ref 30.0–45.0)
PO2 VEN: 44 mmHg (ref 30.0–45.0)
TCO2: 27 mmol/L (ref 0–100)
TCO2: 28 mmol/L (ref 0–100)
pCO2, Ven: 59 mmHg — ABNORMAL HIGH (ref 45.0–50.0)
pH, Ven: 7.238 — ABNORMAL LOW (ref 7.250–7.300)

## 2013-06-15 LAB — CBC
HEMATOCRIT: 51.4 % (ref 39.0–52.0)
Hemoglobin: 18 g/dL — ABNORMAL HIGH (ref 13.0–17.0)
MCH: 32.2 pg (ref 26.0–34.0)
MCHC: 35 g/dL (ref 30.0–36.0)
MCV: 91.9 fL (ref 78.0–100.0)
Platelets: 166 10*3/uL (ref 150–400)
RBC: 5.59 MIL/uL (ref 4.22–5.81)
RDW: 15.3 % (ref 11.5–15.5)
WBC: 9.8 10*3/uL (ref 4.0–10.5)

## 2013-06-15 LAB — SEDIMENTATION RATE: Sed Rate: 8 mm/hr (ref 0–16)

## 2013-06-15 LAB — BASIC METABOLIC PANEL
BUN: 95 mg/dL — ABNORMAL HIGH (ref 6–23)
CHLORIDE: 82 meq/L — AB (ref 96–112)
CO2: 21 meq/L (ref 19–32)
Calcium: 9.1 mg/dL (ref 8.4–10.5)
Creatinine, Ser: 2.76 mg/dL — ABNORMAL HIGH (ref 0.50–1.35)
GFR calc Af Amer: 23 mL/min — ABNORMAL LOW (ref >90)
GFR calc non Af Amer: 20 mL/min — ABNORMAL LOW (ref >90)
GLUCOSE: 285 mg/dL — AB (ref 70–99)
POTASSIUM: 5.2 meq/L (ref 3.7–5.3)
Sodium: 121 mEq/L — CL (ref 137–147)

## 2013-06-15 LAB — GLUCOSE, CAPILLARY
GLUCOSE-CAPILLARY: 294 mg/dL — AB (ref 70–99)
GLUCOSE-CAPILLARY: 310 mg/dL — AB (ref 70–99)
Glucose-Capillary: 310 mg/dL — ABNORMAL HIGH (ref 70–99)
Glucose-Capillary: 301 mg/dL — ABNORMAL HIGH (ref 70–99)

## 2013-06-15 LAB — OSMOLALITY, URINE: Osmolality, Ur: 348 mOsm/kg — ABNORMAL LOW (ref 390–1090)

## 2013-06-15 SURGERY — RIGHT HEART CATH
Anesthesia: LOCAL

## 2013-06-15 MED ORDER — SODIUM CHLORIDE 0.9 % IJ SOLN: 3.0000 mL | INTRAMUSCULAR | Status: DC | PRN

## 2013-06-15 MED ORDER — ALBUTEROL SULFATE (2.5 MG/3ML) 0.083% IN NEBU: 2.5000 mg | INHALATION_SOLUTION | RESPIRATORY_TRACT | Status: DC | PRN

## 2013-06-15 MED ORDER — SODIUM CHLORIDE 0.9 % IV SOLN
250.0000 mL | INTRAVENOUS | Status: DC | PRN
  Administered 2013-06-15: 250 mL via INTRAVENOUS

## 2013-06-15 MED ORDER — FUROSEMIDE 10 MG/ML IJ SOLN
80.0000 mg | Freq: Once | INTRAMUSCULAR | Status: AC
  Administered 2013-06-15: 80 mg via INTRAVENOUS

## 2013-06-15 MED ORDER — HEPARIN (PORCINE) IN NACL 2-0.9 UNIT/ML-% IJ SOLN
INTRAMUSCULAR | Status: AC
  Filled 2013-06-15: qty 500

## 2013-06-15 MED ORDER — SODIUM CHLORIDE 0.9 % IJ SOLN
3.0000 mL | Freq: Two times a day (BID) | INTRAMUSCULAR | Status: DC
  Administered 2013-06-15: 3 mL via INTRAVENOUS

## 2013-06-15 MED ORDER — METHYLPREDNISOLONE SODIUM SUCC 125 MG IJ SOLR
60.0000 mg | Freq: Four times a day (QID) | INTRAMUSCULAR | Status: DC
  Administered 2013-06-15 – 2013-06-17 (×7): 60 mg via INTRAVENOUS
  Filled 2013-06-15 (×6): qty 0.96
  Filled 2013-06-15: qty 2
  Filled 2013-06-15 (×2): qty 0.96

## 2013-06-15 MED ORDER — LIDOCAINE HCL (PF) 1 % IJ SOLN
INTRAMUSCULAR | Status: AC
  Filled 2013-06-15: qty 30

## 2013-06-15 MED ORDER — SODIUM CHLORIDE 0.9 % IJ SOLN: 3.0000 mL | Freq: Two times a day (BID) | INTRAMUSCULAR | Status: DC

## 2013-06-15 MED ORDER — FUROSEMIDE 10 MG/ML IJ SOLN
80.0000 mg | Freq: Two times a day (BID) | INTRAMUSCULAR | Status: DC
  Administered 2013-06-16: 80 mg via INTRAVENOUS
  Filled 2013-06-15 (×3): qty 8

## 2013-06-15 MED ORDER — MILRINONE IN DEXTROSE 20 MG/100ML IV SOLN
0.1250 ug/kg/min | INTRAVENOUS | Status: DC
  Administered 2013-06-15 – 2013-06-16 (×2): 0.125 ug/kg/min via INTRAVENOUS
  Filled 2013-06-15 (×2): qty 100

## 2013-06-15 MED ORDER — INSULIN GLARGINE 100 UNIT/ML ~~LOC~~ SOLN
30.0000 [IU] | Freq: Every day | SUBCUTANEOUS | Status: DC
  Administered 2013-06-15 – 2013-06-16 (×2): 30 [IU] via SUBCUTANEOUS
  Filled 2013-06-15 (×3): qty 0.3

## 2013-06-15 NOTE — Progress Notes (Signed)
SUBJECTIVE:   80 y.o. male with a history of CAD, atrial fibrillation on Xarelto and biventricular ICD admitted with NSTEMI with elevated trop to 14>>13. ECG atrial fibrillation with predominant BiV pacing.   Cath revealed 5/26 Diffuse small to moderate branch OM and diagonal disease with occlusion of the RCA with left to right collaterals.  Cardiopathy out of proportion with the extent of CAD as noted above.  Moderate Pulmonary Venous Hypertension  [facility] 5/26: RA press =13/13, RV 60/10, PA 63/28, PCWP 20/21, LV 107/14. CO/CI 3.11/1.4 by Fick method and 2.87/1.29 by thermodilution.   2 d echo 5/26: EF 30-35%, akinesis of the basal-mid inferior myocardium. PA pressure 36 mmHg.  (2 d echo 07/2011: EF 50-55%)   On Xarelto. Off diuretics since yesterday.  CT chest without contrast revealed pulmonary interstitial edema and bilateral effusions with severe cardiomegaly. Also reported a right hepatic lobe mass ? Malignancy  Creatinine level is elevated 2.85 >2.76, hyponatremia     . acetaminophen  500 mg Oral Q12H  . antiseptic oral rinse  15 mL Mouth Rinse BID  . aspirin  81 mg Oral Daily  . atorvastatin  80 mg Oral Daily  . collagenase   Topical Daily  . digoxin  125 mcg Oral QODAY  . DULoxetine  60 mg Oral Q24H  . fluticasone  2 spray Each Nare Daily  . gabapentin  100 mg Oral Q24H  . insulin aspart  0-15 Units Subcutaneous TID WC & HS  . insulin glargine  10 Units Subcutaneous QHS  . ipratropium-albuterol  3 mL Nebulization Q6H  . isosorbide mononitrate  60 mg Oral Q24H  . levothyroxine  100 mcg Oral QAC breakfast  . loratadine  10 mg Oral Daily  . methylPREDNISolone (SOLU-MEDROL) injection  60 mg Intravenous Q12H  . metoprolol succinate  25 mg Oral Q24H  . morphine  30 mg Oral TID  . multivitamin  1 tablet Oral QHS  . pantoprazole  40 mg Oral Daily  . rivaroxaban  15 mg Oral Q supper  . sodium chloride  3 mL Intravenous Q12H  . sodium chloride  3 mL Intravenous Q12H        PHYSICAL EXAM Filed Vitals:   06/08/2013 0025 06/16/2013 0031 06/05/2013 0112 05/28/2013 0448  BP:  140/72    Pulse:  81    Temp: 97.4 F (36.3 C)   97.2 F (36.2 C)  TempSrc: Axillary   Oral  Resp:  16    Height:      Weight:    227 lb 1.2 oz (103 kg)  SpO2:  92% 92%    General: A&OX3. No acute distress.  Neck: No carotid bruits. no JVD Lungs: clear bilaterally,  Heart: IRRegular rate and rhythm with S1 S2. No S3 or S4. 2/6 murmur, no rubs, or gallops appreciated.  Abdomen: Soft, non-tender, non-distended with normoactive bowel sounds. No hepatomegaly. No rebound/guarding. No obvious abdominal masses.  Msk: movies all extremities.  Extremities: No clubbing or cyanosis. No edema. Distal pedal pulses are 2+. Cath site - no complications Skin: No rashes or lesions noted  LABS: Lab Results  Component Value Date   TROPONINI 7.92* 06/06/2013   Results for orders placed during the hospital encounter of 05/27/2013 (from the past 24 hour(s))  GLUCOSE, CAPILLARY     Status: Abnormal   Collection Time    06/14/13  7:40 AM      Result Value Ref Range   Glucose-Capillary 236 (*) 70 - 99 mg/dL  BASIC METABOLIC PANEL     Status: Abnormal   Collection Time    06/14/13 10:12 AM      Result Value Ref Range   Sodium 122 (*) 137 - 147 mEq/L   Potassium 4.9  3.7 - 5.3 mEq/L   Chloride 84 (*) 96 - 112 mEq/L   CO2 20  19 - 32 mEq/L   Glucose, Bld 200 (*) 70 - 99 mg/dL   BUN 79 (*) 6 - 23 mg/dL   Creatinine, Ser 2.62 (*) 0.50 - 1.35 mg/dL   Calcium 8.9  8.4 - 10.5 mg/dL   GFR calc non Af Amer 21 (*) >90 mL/min   GFR calc Af Amer 24 (*) >90 mL/min  SEDIMENTATION RATE     Status: None   Collection Time    06/14/13 10:58 AM      Result Value Ref Range   Sed Rate 10  0 - 16 mm/hr  GLUCOSE, CAPILLARY     Status: Abnormal   Collection Time    06/14/13 11:36 AM      Result Value Ref Range   Glucose-Capillary 196 (*) 70 - 99 mg/dL  BLOOD GAS, ARTERIAL     Status: Abnormal   Collection Time     06/14/13 12:20 PM      Result Value Ref Range   FIO2 1.00     Delivery systems NON-REBREATHER OXYGEN MASK     pH, Arterial 7.351  7.350 - 7.450   pCO2 arterial 39.1  35.0 - 45.0 mmHg   pO2, Arterial 77.5 (*) 80.0 - 100.0 mmHg   Bicarbonate 21.1  20.0 - 24.0 mEq/L   TCO2 22.3  0 - 100 mmol/L   Acid-base deficit 3.6 (*) 0.0 - 2.0 mmol/L   O2 Saturation 93.7     Patient temperature 98.6     Collection site LEFT RADIAL     Drawn by [id number]     Sample type ARTERIAL DRAW     Allens test (pass/fail) PASS  PASS  GLUCOSE, CAPILLARY     Status: Abnormal   Collection Time    06/14/13  4:24 PM      Result Value Ref Range   Glucose-Capillary 236 (*) 70 - 99 mg/dL  BASIC METABOLIC PANEL     Status: Abnormal   Collection Time    06/14/13  4:30 PM      Result Value Ref Range   Sodium 122 (*) 137 - 147 mEq/L   Potassium 5.2  3.7 - 5.3 mEq/L   Chloride 83 (*) 96 - 112 mEq/L   CO2 22  19 - 32 mEq/L   Glucose, Bld 228 (*) 70 - 99 mg/dL   BUN 86 (*) 6 - 23 mg/dL   Creatinine, Ser 2.85 (*) 0.50 - 1.35 mg/dL   Calcium 9.2  8.4 - 10.5 mg/dL   GFR calc non Af Amer 19 (*) >90 mL/min   GFR calc Af Amer 22 (*) >90 mL/min  SODIUM, URINE, RANDOM     Status: None   Collection Time    06/14/13  8:49 PM      Result Value Ref Range   Sodium, Ur <20    OSMOLALITY, URINE     Status: Abnormal   Collection Time    06/14/13  8:49 PM      Result Value Ref Range   Osmolality, Ur 348 (*) 390 - 1090 mOsm/kg  URINALYSIS, ROUTINE W REFLEX MICROSCOPIC     Status: None   Collection Time  06/14/13  8:49 PM      Result Value Ref Range   Color, Urine YELLOW  YELLOW   APPearance CLEAR  CLEAR   Specific Gravity, Urine 1.021  1.005 - 1.030   pH 5.0  5.0 - 8.0   Glucose, UA NEGATIVE  NEGATIVE mg/dL   Hgb urine dipstick NEGATIVE  NEGATIVE   Bilirubin Urine NEGATIVE  NEGATIVE   Ketones, ur NEGATIVE  NEGATIVE mg/dL   Protein, ur NEGATIVE  NEGATIVE mg/dL   Urobilinogen, UA 1.0  0.0 - 1.0 mg/dL   Nitrite NEGATIVE   NEGATIVE   Leukocytes, UA NEGATIVE  NEGATIVE  GLUCOSE, CAPILLARY     Status: Abnormal   Collection Time    06/14/13  9:23 PM      Result Value Ref Range   Glucose-Capillary 292 (*) 70 - 99 mg/dL   Comment 1 Documented in Chart     Comment 2 Notify RN    CBC     Status: Abnormal   Collection Time    06/04/2013  3:45 AM      Result Value Ref Range   WBC 9.8  4.0 - 10.5 K/uL   RBC 5.59  4.22 - 5.81 MIL/uL   Hemoglobin 18.0 (*) 13.0 - 17.0 g/dL   HCT 51.4  39.0 - 52.0 %   MCV 91.9  78.0 - 100.0 fL   MCH 32.2  26.0 - 34.0 pg   MCHC 35.0  30.0 - 36.0 g/dL   RDW 15.3  11.5 - 15.5 %   Platelets 166  150 - 400 K/uL  BASIC METABOLIC PANEL     Status: Abnormal   Collection Time    05/22/2013  3:45 AM      Result Value Ref Range   Sodium 121 (*) 137 - 147 mEq/L   Potassium 5.2  3.7 - 5.3 mEq/L   Chloride 82 (*) 96 - 112 mEq/L   CO2 21  19 - 32 mEq/L   Glucose, Bld 285 (*) 70 - 99 mg/dL   BUN 95 (*) 6 - 23 mg/dL   Creatinine, Ser 2.76 (*) 0.50 - 1.35 mg/dL   Calcium 9.1  8.4 - 10.5 mg/dL   GFR calc non Af Amer 20 (*) >90 mL/min   GFR calc Af Amer 23 (*) >90 mL/min    Intake/Output Summary (Last 24 hours) at 06/04/2013 5361 Last data filed at 06/14/13 2000  Gross per 24 hour  Intake   1066 ml  Output    600 ml  Net    466 ml     ASSESSMENT AND PLAN:  Principal Problem:   NSTEMI (non-ST elevated myocardial infarction) Active Problems:   HYPERLIPIDEMIA-MIXED   Essential hypertension, benign   CARDIOMYOPATHY, ISCHEMIC   COPD   Thrombocytosis   CHF (congestive heart failure)   Hypoxia   Hyponatremia   Liver mass, right lobe   CAD: NSTEMI. Patient has history of CAD (07/2004 cath with occluded RCA and collaterals). Denies chest pain or SOB today. Trop trended down 14>13>7.92. Cath without acute occlusion but with Diffuse small to moderate branch OM and diagonal disease with occlusion of the RCA with left to right collaterals. Nothing treatable. Low CO/CI noted on [facility]. Plan  -  cont with Heparin gt and plan to transition back to Xarelto  - ASA, atorvastatin 80 mg daily.  - will concentrate on treatment of heart failure as it appears that leading issue is fluid overload in the setting of Afib   Hypoxic respiratory  failure: acute Vs chronic. Patient has reported history of COPD but there is no documentation of PFTs. He continues to require high oxygen with NRB at 15L/min. PE was considered less likely because he takes oral anticoagulants chronically. Chest ct scan 06/14/2013 revealed pulmonary edema and bilateral pleural effusions. Amiodarone toxicity has been considered and therefore it has been discontinued.  Plan  - pulmonary service is following him in consultation  - it is likely that he has interstitial edema accounting for much of his hypoxia but diuresis has been complicated with rising creatinine. Lasix is on hold for now.  - advanced HF team might be helpful  ?COPD: No PFTs. Home oxygen at baseline. Suspect worsening oxygen saturation is due to CHF.  Plan  - started on Solu-medrol  - Duo nebs  Atrial fibrillation: History of PAF. EKG no afib this am. On home amiodarone. Atrial fibrillation will make his BiV pacing much less efficient and may be contributing to current CHF exacerbation.  Plan  - off Amiodarone due to possible lung toxicity  - Consider DCCV in the future if he remains in afib.  - on Xarelto - Continue home Toprol XL.   Hyponatremia: with CHF and hypervolemia in addition to loop diuretics use. Urine sodium <20 which does not support renal etiology.  Plan - free water restriction. Monitor with BMETs   Acute on chronic diastolic CHF: EF 26-83% (improved) in 2013. Worse on repeat echo with EF 30-35%. He is requiring face mask for oxygenation. Noncompliant with home O2 for COPD. Wt has not changed much Plan -off lasix.  - marginal urine output  - Hold irbesartan with elevated creatinine.  - Continue Toprol XL.  Leukocytosis: resolved. CKD:  Creatinine 1.7 >>2.7. Not making a lot of urine. Off diuretics due to rising creatinine.  Hold irbesartan. Plan  - monitor BMETs Possible dementia: no formal diagnosis Diabetes: on insulin therapy Chronic back pain: cont with home MS-contin Liver mass: right hepatic mass noted on CT scan. Radiology recommended MRI of abdomen. CT with contrast will not be pursued due to renal funcition Code status: DNR/DNI.   Discussed with Dr Martinique.   Signed:  Jessee Avers, MD PGY-2 Internal Medicine Teaching Service Pager: [phone] 05/23/2013, 6:55 AM   Patient seen and examined and history reviewed. Agree with above findings and plan. Still has high oxygen requirements. Remains in atrial fibrillation with controlled rate. Amiodarone stopped due to concern over pulmonary toxicity. CT of chest suggests more pulmonary edema. Lasix on hold due to worsening renal failure. Xarelto dose adjusted. Will start IV milrinone to see if increased cardiac output will help. Sed rate normal. Appreciate pulmonary input. May want to consider central line to assess CVP. Patient did not respond well to IV lasix. ? If higher dose needed. Restrict free water/fluid due to hyponatremia.  Hepatic lobe lesion will need to be assessed later once acute cardiopulmonary issues improved. Will ask Heart failure team to see.   Ander Slade [facility] 05/22/2013 11:39 AM

## 2013-06-15 NOTE — Progress Notes (Signed)
HF team was asked to see patient by Dr. Martinique  [name] is a 80 yo male with a history of CAD, atrial fibrillation, COPD (non compliant with home O2) diastolic heart failure and thrombocytosis. He has a BiV ICD which the VT/VF therapies have been turned off.  He presented to [facility] on 5/25 with increased SOB and his troponin was greater than 30 and was transferred here for NSTEMI. He had a R/LHC showing diffuse small to moderate branch OM and diagonal disease with occlusion of the RCA with left to right collaterals, cardiomyopathy out of proportion with the extent of CAD and moderate pulmonary venous HTN.   RA 13, RV 60/10, PA 63/28 (41), PCWP 20, Fick CO/CI 3.1/1.4, Thermo CO/CI 2.87/1.29, PA sat 57%  ECHO 5/26: EF 30-35%, akinesis of the basal-mid inferior myocardium. PA pressure 36 mmHg.  CT chest without contrast revealed pulmonary interstitial edema and bilateral effusions with severe cardiomegaly. Cannot exclude pneumonitis. Also reported a right hepatic lobe mass ? Malignanc   He has been receiving IV diuretics along with metolazone with sluggish UOP and his Creatinine has been rising. He was started on Amiodarone gtt on admission for afib and transitioned to PO amiodarone, however it was discontinued d/t concern for possible amiodarone toxicity. This morning started on IV milrinone. Continue to have severe dyspnea now on 100% FM   ESR 11   Physical Exam: Elderly Chronically ill appearing  HEENT: normal wearing FM Neck: supple. JVP 10 Carotids 2+ bilat; no bruits. No lymphadenopathy or thryomegaly appreciated. Cor: PMI  Laterally displaced. Regular rate & rhythm.  Lungs: diffuse crackles Abdomen: soft, nontender, nondistended. No hepatosplenomegaly. No bruits or masses. Good bowel sounds. Extremities: cool no cyanosis, clubbing, rash, edema Neuro: alert & orientedx3, cranial nerves grossly intact. moves all 4 extremities w/o difficulty.    Results for orders placed  during the hospital encounter of 05/19/2013 (from the past 48 hour(s))  GLUCOSE, CAPILLARY     Status: Abnormal   Collection Time    06/13/13  4:25 PM      Result Value Ref Range   Glucose-Capillary 268 (*) 70 - 99 mg/dL  GLUCOSE, CAPILLARY     Status: Abnormal   Collection Time    06/13/13  9:25 PM      Result Value Ref Range   Glucose-Capillary 237 (*) 70 - 99 mg/dL  CBC     Status: Abnormal   Collection Time    06/14/13  2:56 AM      Result Value Ref Range   WBC 12.8 (*) 4.0 - 10.5 K/uL   RBC 5.59  4.22 - 5.81 MIL/uL   Hemoglobin 18.1 (*) 13.0 - 17.0 g/dL   HCT 51.0  39.0 - 52.0 %   MCV 91.2  78.0 - 100.0 fL   MCH 32.4  26.0 - 34.0 pg   MCHC 35.5  30.0 - 36.0 g/dL   RDW 15.3  11.5 - 15.5 %   Platelets 170  150 - 400 K/uL  GLUCOSE, CAPILLARY     Status: Abnormal   Collection Time    06/14/13  7:40 AM      Result Value Ref Range   Glucose-Capillary 236 (*) 70 - 99 mg/dL  BASIC METABOLIC PANEL     Status: Abnormal   Collection Time    06/14/13 10:12 AM      Result Value Ref Range   Sodium 122 (*) 137 - 147 mEq/L   Potassium 4.9  3.7 - 5.3 mEq/L  Chloride 84 (*) 96 - 112 mEq/L   CO2 20  19 - 32 mEq/L   Glucose, Bld 200 (*) 70 - 99 mg/dL   BUN 79 (*) 6 - 23 mg/dL   Creatinine, Ser 2.62 (*) 0.50 - 1.35 mg/dL   Calcium 8.9  8.4 - 10.5 mg/dL   GFR calc non Af Amer 21 (*) >90 mL/min   GFR calc Af Amer 24 (*) >90 mL/min   Comment: (NOTE)     The eGFR has been calculated using the CKD EPI equation.     This calculation has not been validated in all clinical situations.     eGFR's persistently <90 mL/min signify possible Chronic Kidney     Disease.  SEDIMENTATION RATE     Status: None   Collection Time    06/14/13 10:58 AM      Result Value Ref Range   Sed Rate 10  0 - 16 mm/hr  GLUCOSE, CAPILLARY     Status: Abnormal   Collection Time    06/14/13 11:36 AM      Result Value Ref Range   Glucose-Capillary 196 (*) 70 - 99 mg/dL  BLOOD GAS, ARTERIAL     Status: Abnormal    Collection Time    06/14/13 12:20 PM      Result Value Ref Range   FIO2 1.00     Delivery systems NON-REBREATHER OXYGEN MASK     pH, Arterial 7.351  7.350 - 7.450   pCO2 arterial 39.1  35.0 - 45.0 mmHg   pO2, Arterial 77.5 (*) 80.0 - 100.0 mmHg   Bicarbonate 21.1  20.0 - 24.0 mEq/L   TCO2 22.3  0 - 100 mmol/L   Acid-base deficit 3.6 (*) 0.0 - 2.0 mmol/L   O2 Saturation 93.7     Patient temperature 98.6     Collection site LEFT RADIAL     Drawn by [id number]     Sample type ARTERIAL DRAW     Allens test (pass/fail) PASS  PASS  GLUCOSE, CAPILLARY     Status: Abnormal   Collection Time    06/14/13  4:24 PM      Result Value Ref Range   Glucose-Capillary 236 (*) 70 - 99 mg/dL  BASIC METABOLIC PANEL     Status: Abnormal   Collection Time    06/14/13  4:30 PM      Result Value Ref Range   Sodium 122 (*) 137 - 147 mEq/L   Potassium 5.2  3.7 - 5.3 mEq/L   Chloride 83 (*) 96 - 112 mEq/L   CO2 22  19 - 32 mEq/L   Glucose, Bld 228 (*) 70 - 99 mg/dL   BUN 86 (*) 6 - 23 mg/dL   Creatinine, Ser 2.85 (*) 0.50 - 1.35 mg/dL   Calcium 9.2  8.4 - 10.5 mg/dL   GFR calc non Af Amer 19 (*) >90 mL/min   GFR calc Af Amer 22 (*) >90 mL/min   Comment: (NOTE)     The eGFR has been calculated using the CKD EPI equation.     This calculation has not been validated in all clinical situations.     eGFR's persistently <90 mL/min signify possible Chronic Kidney     Disease.  SODIUM, URINE, RANDOM     Status: None   Collection Time    06/14/13  8:49 PM      Result Value Ref Range   Sodium, Ur <20     Comment: REPEATED TO  VERIFY  OSMOLALITY, URINE     Status: Abnormal   Collection Time    06/14/13  8:49 PM      Result Value Ref Range   Osmolality, Ur 348 (*) 390 - 1090 mOsm/kg   Comment: Performed at [place]     Status: None   Collection Time    06/14/13  8:49 PM      Result Value Ref Range   Color, Urine YELLOW  YELLOW   APPearance CLEAR   CLEAR   Specific Gravity, Urine 1.021  1.005 - 1.030   pH 5.0  5.0 - 8.0   Glucose, UA NEGATIVE  NEGATIVE mg/dL   Hgb urine dipstick NEGATIVE  NEGATIVE   Bilirubin Urine NEGATIVE  NEGATIVE   Ketones, ur NEGATIVE  NEGATIVE mg/dL   Protein, ur NEGATIVE  NEGATIVE mg/dL   Urobilinogen, UA 1.0  0.0 - 1.0 mg/dL   Nitrite NEGATIVE  NEGATIVE   Leukocytes, UA NEGATIVE  NEGATIVE   Comment: MICROSCOPIC NOT DONE ON URINES WITH NEGATIVE PROTEIN, BLOOD, LEUKOCYTES, NITRITE, OR GLUCOSE <1000 mg/dL.  GLUCOSE, CAPILLARY     Status: Abnormal   Collection Time    06/14/13  9:23 PM      Result Value Ref Range   Glucose-Capillary 292 (*) 70 - 99 mg/dL   Comment 1 Documented in Chart     Comment 2 Notify RN    CBC     Status: Abnormal   Collection Time    06/10/2013  3:45 AM      Result Value Ref Range   WBC 9.8  4.0 - 10.5 K/uL   RBC 5.59  4.22 - 5.81 MIL/uL   Hemoglobin 18.0 (*) 13.0 - 17.0 g/dL   HCT 51.4  39.0 - 52.0 %   MCV 91.9  78.0 - 100.0 fL   MCH 32.2  26.0 - 34.0 pg   MCHC 35.0  30.0 - 36.0 g/dL   RDW 15.3  11.5 - 15.5 %   Platelets 166  150 - 400 K/uL  BASIC METABOLIC PANEL     Status: Abnormal   Collection Time    06/10/2013  3:45 AM      Result Value Ref Range   Sodium 121 (*) 137 - 147 mEq/L   Comment: CRITICAL RESULT CALLED TO, READ BACK BY AND VERIFIED WITH:     Arletha Pili RN [phone] COLCLOUGH, S   Potassium 5.2  3.7 - 5.3 mEq/L   Chloride 82 (*) 96 - 112 mEq/L   CO2 21  19 - 32 mEq/L   Glucose, Bld 285 (*) 70 - 99 mg/dL   BUN 95 (*) 6 - 23 mg/dL   Creatinine, Ser 2.76 (*) 0.50 - 1.35 mg/dL   Calcium 9.1  8.4 - 10.5 mg/dL   GFR calc non Af Amer 20 (*) >90 mL/min   GFR calc Af Amer 23 (*) >90 mL/min   Comment: (NOTE)     The eGFR has been calculated using the CKD EPI equation.     This calculation has not been validated in all clinical situations.     eGFR's persistently <90 mL/min signify possible Chronic Kidney     Disease.  GLUCOSE, CAPILLARY     Status: Abnormal    Collection Time    05/21/2013  7:43 AM      Result Value Ref Range   Glucose-Capillary 310 (*) 70 - 99 mg/dL  GLUCOSE, CAPILLARY  Status: Abnormal   Collection Time    05/22/2013 12:02 PM      Result Value Ref Range   Glucose-Capillary 294 (*) 70 - 99 mg/dL    A/ 1. A/c respiratory failure 2. A/c systolic HF 3. Cardiogenic shock 4. Acute renal failure, stage IV 5. COPD 6. Afib 7. Hyponatremia  He is critically ill with multi-system organ failure. I suspect main issue is cardiogenic but pulmonary status also poor. I discussed this with him and his wife. We also talked about code status and he has rescinded his DNR and wants aggressive measures despite our indicating that his prognosis is quite poor. Will take to cath lab for swan placement to further guide therapies. With low ESR, I think pneumonitis very unlikely but will ask P/CCM if they think trial of empiric steroids are indicated. In-hospital mortality risk is very high.   The patient is critically ill with multiple organ systems failure and requires high complexity decision making for assessment and support, frequent evaluation and titration of therapies, application of advanced monitoring technologies and extensive interpretation of multiple databases.   Critical Care Time devoted to patient care services described in this note is 45 Minutes.    Shaune Pascal Shellee Streng,MD 3:36 PM

## 2013-06-15 NOTE — H&P (View-Only) (Signed)
HF team was asked to see patient by Dr. Martinique  [name] is a 80 yo male with a history of CAD, atrial fibrillation, COPD (non compliant with home O2) diastolic heart failure and thrombocytosis. He has a BiV ICD which the VT/VF therapies have been turned off.  He presented to [facility] on 5/25 with increased SOB and his troponin was greater than 30 and was transferred here for NSTEMI. He had a R/LHC showing diffuse small to moderate branch OM and diagonal disease with occlusion of the RCA with left to right collaterals, cardiomyopathy out of proportion with the extent of CAD and moderate pulmonary venous HTN.   RA 13, RV 60/10, PA 63/28 (41), PCWP 20, Fick CO/CI 3.1/1.4, Thermo CO/CI 2.87/1.29, PA sat 57%  ECHO 5/26: EF 30-35%, akinesis of the basal-mid inferior myocardium. PA pressure 36 mmHg.  CT chest without contrast revealed pulmonary interstitial edema and bilateral effusions with severe cardiomegaly. Cannot exclude pneumonitis. Also reported a right hepatic lobe mass ? Malignanc   He has been receiving IV diuretics along with metolazone with sluggish UOP and his Creatinine has been rising. He was started on Amiodarone gtt on admission for afib and transitioned to PO amiodarone, however it was discontinued d/t concern for possible amiodarone toxicity. This morning started on IV milrinone. Continue to have severe dyspnea now on 100% FM   ESR 11   Physical Exam: Elderly Chronically ill appearing  HEENT: normal wearing FM Neck: supple. JVP 10 Carotids 2+ bilat; no bruits. No lymphadenopathy or thryomegaly appreciated. Cor: PMI  Laterally displaced. Regular rate & rhythm.  Lungs: diffuse crackles Abdomen: soft, nontender, nondistended. No hepatosplenomegaly. No bruits or masses. Good bowel sounds. Extremities: cool no cyanosis, clubbing, rash, edema Neuro: alert & orientedx3, cranial nerves grossly intact. moves all 4 extremities w/o difficulty.    Results for orders placed  during the hospital encounter of 06/01/2013 (from the past 48 hour(s))  GLUCOSE, CAPILLARY     Status: Abnormal   Collection Time    06/13/13  4:25 PM      Result Value Ref Range   Glucose-Capillary 268 (*) 70 - 99 mg/dL  GLUCOSE, CAPILLARY     Status: Abnormal   Collection Time    06/13/13  9:25 PM      Result Value Ref Range   Glucose-Capillary 237 (*) 70 - 99 mg/dL  CBC     Status: Abnormal   Collection Time    06/14/13  2:56 AM      Result Value Ref Range   WBC 12.8 (*) 4.0 - 10.5 K/uL   RBC 5.59  4.22 - 5.81 MIL/uL   Hemoglobin 18.1 (*) 13.0 - 17.0 g/dL   HCT 51.0  39.0 - 52.0 %   MCV 91.2  78.0 - 100.0 fL   MCH 32.4  26.0 - 34.0 pg   MCHC 35.5  30.0 - 36.0 g/dL   RDW 15.3  11.5 - 15.5 %   Platelets 170  150 - 400 K/uL  GLUCOSE, CAPILLARY     Status: Abnormal   Collection Time    06/14/13  7:40 AM      Result Value Ref Range   Glucose-Capillary 236 (*) 70 - 99 mg/dL  BASIC METABOLIC PANEL     Status: Abnormal   Collection Time    06/14/13 10:12 AM      Result Value Ref Range   Sodium 122 (*) 137 - 147 mEq/L   Potassium 4.9  3.7 - 5.3 mEq/L  Chloride 84 (*) 96 - 112 mEq/L   CO2 20  19 - 32 mEq/L   Glucose, Bld 200 (*) 70 - 99 mg/dL   BUN 79 (*) 6 - 23 mg/dL   Creatinine, Ser 2.62 (*) 0.50 - 1.35 mg/dL   Calcium 8.9  8.4 - 10.5 mg/dL   GFR calc non Af Amer 21 (*) >90 mL/min   GFR calc Af Amer 24 (*) >90 mL/min   Comment: (NOTE)     The eGFR has been calculated using the CKD EPI equation.     This calculation has not been validated in all clinical situations.     eGFR's persistently <90 mL/min signify possible Chronic Kidney     Disease.  SEDIMENTATION RATE     Status: None   Collection Time    06/14/13 10:58 AM      Result Value Ref Range   Sed Rate 10  0 - 16 mm/hr  GLUCOSE, CAPILLARY     Status: Abnormal   Collection Time    06/14/13 11:36 AM      Result Value Ref Range   Glucose-Capillary 196 (*) 70 - 99 mg/dL  BLOOD GAS, ARTERIAL     Status: Abnormal    Collection Time    06/14/13 12:20 PM      Result Value Ref Range   FIO2 1.00     Delivery systems NON-REBREATHER OXYGEN MASK     pH, Arterial 7.351  7.350 - 7.450   pCO2 arterial 39.1  35.0 - 45.0 mmHg   pO2, Arterial 77.5 (*) 80.0 - 100.0 mmHg   Bicarbonate 21.1  20.0 - 24.0 mEq/L   TCO2 22.3  0 - 100 mmol/L   Acid-base deficit 3.6 (*) 0.0 - 2.0 mmol/L   O2 Saturation 93.7     Patient temperature 98.6     Collection site LEFT RADIAL     Drawn by [id number]     Sample type ARTERIAL DRAW     Allens test (pass/fail) PASS  PASS  GLUCOSE, CAPILLARY     Status: Abnormal   Collection Time    06/14/13  4:24 PM      Result Value Ref Range   Glucose-Capillary 236 (*) 70 - 99 mg/dL  BASIC METABOLIC PANEL     Status: Abnormal   Collection Time    06/14/13  4:30 PM      Result Value Ref Range   Sodium 122 (*) 137 - 147 mEq/L   Potassium 5.2  3.7 - 5.3 mEq/L   Chloride 83 (*) 96 - 112 mEq/L   CO2 22  19 - 32 mEq/L   Glucose, Bld 228 (*) 70 - 99 mg/dL   BUN 86 (*) 6 - 23 mg/dL   Creatinine, Ser 2.85 (*) 0.50 - 1.35 mg/dL   Calcium 9.2  8.4 - 10.5 mg/dL   GFR calc non Af Amer 19 (*) >90 mL/min   GFR calc Af Amer 22 (*) >90 mL/min   Comment: (NOTE)     The eGFR has been calculated using the CKD EPI equation.     This calculation has not been validated in all clinical situations.     eGFR's persistently <90 mL/min signify possible Chronic Kidney     Disease.  SODIUM, URINE, RANDOM     Status: None   Collection Time    06/14/13  8:49 PM      Result Value Ref Range   Sodium, Ur <20     Comment: REPEATED TO  VERIFY  OSMOLALITY, URINE     Status: Abnormal   Collection Time    06/14/13  8:49 PM      Result Value Ref Range   Osmolality, Ur 348 (*) 390 - 1090 mOsm/kg   Comment: Performed at [facility]     Status: None   Collection Time    06/14/13  8:49 PM      Result Value Ref Range   Color, Urine YELLOW  YELLOW   APPearance CLEAR   CLEAR   Specific Gravity, Urine 1.021  1.005 - 1.030   pH 5.0  5.0 - 8.0   Glucose, UA NEGATIVE  NEGATIVE mg/dL   Hgb urine dipstick NEGATIVE  NEGATIVE   Bilirubin Urine NEGATIVE  NEGATIVE   Ketones, ur NEGATIVE  NEGATIVE mg/dL   Protein, ur NEGATIVE  NEGATIVE mg/dL   Urobilinogen, UA 1.0  0.0 - 1.0 mg/dL   Nitrite NEGATIVE  NEGATIVE   Leukocytes, UA NEGATIVE  NEGATIVE   Comment: MICROSCOPIC NOT DONE ON URINES WITH NEGATIVE PROTEIN, BLOOD, LEUKOCYTES, NITRITE, OR GLUCOSE <1000 mg/dL.  GLUCOSE, CAPILLARY     Status: Abnormal   Collection Time    06/14/13  9:23 PM      Result Value Ref Range   Glucose-Capillary 292 (*) 70 - 99 mg/dL   Comment 1 Documented in Chart     Comment 2 Notify RN    CBC     Status: Abnormal   Collection Time    05/21/2013  3:45 AM      Result Value Ref Range   WBC 9.8  4.0 - 10.5 K/uL   RBC 5.59  4.22 - 5.81 MIL/uL   Hemoglobin 18.0 (*) 13.0 - 17.0 g/dL   HCT 51.4  39.0 - 52.0 %   MCV 91.9  78.0 - 100.0 fL   MCH 32.2  26.0 - 34.0 pg   MCHC 35.0  30.0 - 36.0 g/dL   RDW 15.3  11.5 - 15.5 %   Platelets 166  150 - 400 K/uL  BASIC METABOLIC PANEL     Status: Abnormal   Collection Time    06/02/2013  3:45 AM      Result Value Ref Range   Sodium 121 (*) 137 - 147 mEq/L   Comment: CRITICAL RESULT CALLED TO, READ BACK BY AND VERIFIED WITH:     Arletha Pili RN [phone] COLCLOUGH, S   Potassium 5.2  3.7 - 5.3 mEq/L   Chloride 82 (*) 96 - 112 mEq/L   CO2 21  19 - 32 mEq/L   Glucose, Bld 285 (*) 70 - 99 mg/dL   BUN 95 (*) 6 - 23 mg/dL   Creatinine, Ser 2.76 (*) 0.50 - 1.35 mg/dL   Calcium 9.1  8.4 - 10.5 mg/dL   GFR calc non Af Amer 20 (*) >90 mL/min   GFR calc Af Amer 23 (*) >90 mL/min   Comment: (NOTE)     The eGFR has been calculated using the CKD EPI equation.     This calculation has not been validated in all clinical situations.     eGFR's persistently <90 mL/min signify possible Chronic Kidney     Disease.  GLUCOSE, CAPILLARY     Status: Abnormal    Collection Time    06/06/2013  7:43 AM      Result Value Ref Range   Glucose-Capillary 310 (*) 70 - 99 mg/dL  GLUCOSE, CAPILLARY  Status: Abnormal   Collection Time    06/07/2013 12:02 PM      Result Value Ref Range   Glucose-Capillary 294 (*) 70 - 99 mg/dL    A/ 1. A/c respiratory failure 2. A/c systolic HF 3. Cardiogenic shock 4. Acute renal failure, stage IV 5. COPD 6. Afib 7. Hyponatremia  He is critically ill with multi-system organ failure. I suspect main issue is cardiogenic but pulmonary status also poor. I discussed this with him and his wife. We also talked about code status and he has rescinded his DNR and wants aggressive measures despite our indicating that his prognosis is quite poor. Will take to cath lab for swan placement to further guide therapies. With low ESR, I think pneumonitis very unlikely but will ask P/CCM if they think trial of empiric steroids are indicated. In-hospital mortality risk is very high.   The patient is critically ill with multiple organ systems failure and requires high complexity decision making for assessment and support, frequent evaluation and titration of therapies, application of advanced monitoring technologies and extensive interpretation of multiple databases.   Critical Care Time devoted to patient care services described in this note is 45 Minutes.    [name] Pascal Rayburn Mundis,MD 3:36 PM

## 2013-06-15 NOTE — Progress Notes (Signed)
[facility]  CONSULT SERVICE NOTE  Due to acute medical issues will hold on rehab consult. Will follow at a distance.   Meredith Staggers, MD, [place]

## 2013-06-15 NOTE — Interval H&P Note (Signed)
History and Physical Interval Note:  05/19/2013 4:30 PM  [name]  has presented today for surgery, with the diagnosis of HF/respiratory failureThe various methods of treatment have been discussed with the patient and family. After consideration of risks, benefits and other options for treatment, the patient has consented to  Procedure(s): RIGHT HEART CATH (N/A) as a surgical intervention .  The patient's history has been reviewed, patient examined, no change in status, stable for surgery.  I have reviewed the patient's chart and labs.  Questions were answered to the patient's satisfaction.     Shaune Pascal Bensimhon

## 2013-06-15 NOTE — Consult Note (Signed)
PULMONARY / CRITICAL CARE MEDICINE   Name: [name] MRN: [id number] DOB: 21-May-1927    ADMISSION DATE:  05/31/2013 CONSULTATION DATE:  06/14/2013  REFERRING MD :  Martinique  PRIMARY SERVICE: Cardiology  CHIEF COMPLAINT:  Hypoxemic respiratory failure   BRIEF PATIENT DESCRIPTION: 80 yo male with CHF, CAD, AF on Amiodarone and Xarelto, ? COPD admitted 5/25 with increasing dyspnea, NSTEMI with troponin 14.  Underwent cardiac cath 5/26 showing cardiomyopathy, RCA occlusion, pulm HTN (PAP 63 torr).  Continued to have respiratory distress, significant hypoxia with apparent worsening of chronic interstitial pulm infiltrates and PCCM consulted.   SIGNIFICANT EVENTS / STUDIES:  5/25  R and L heart cath >>> cardiomyopathy out of proportion to CAD, known RCA occlusion, diffuse small branch OM and diagonal disease ~70-80%, Pulm HTN PAP 63 5/26  TTE >>>  EF 30-35%, akinesis of the basal-mid inferior myocardium. PA pressure 36 mmHg.  5/28  Chest CT >>> Bilateral effusions and interstitial edema, possible pneumonitis; severe cardiomegaly and small pericardial effusion; Ill-defined lucency in the right hepatic lobe  LINES / TUBES:  CULTURES:  ANTIBIOTICS:  INTERVAL HISTORY:  No acute overnight events, oxygen requirements remain high.  VITAL SIGNS: Temp:  [97 F (36.1 C)-98.6 F (37 C)] 98.6 F (37 C) (05/29 1200) Pulse Rate:  [81-89] 81 (05/29 0031) Resp:  [7-17] 16 (05/29 0734) BP: (114-148)/(61-75) 148/61 mmHg (05/29 0734) SpO2:  [91 %-94 %] 94 % (05/29 0914) FiO2 (%):  [100 %] 100 % (05/29 0914) Weight:  [103 kg (227 lb 1.2 oz)] 103 kg (227 lb 1.2 oz) (05/29 0448)  HEMODYNAMICS:   VENTILATOR SETTINGS: Vent Mode:  [-]  FiO2 (%):  [100 %] 100 %  INTAKE / OUTPUT: Intake/Output     05/28 0701 - 05/29 0700 05/29 0701 - 05/30 0700   P.O. 1060 60   I.V. (mL/kg) 6 (0.1)    Other     Total Intake(mL/kg) 1066 (10.3) 60 (0.6)   Urine (mL/kg/hr) 600 (0.2) 650 (1)   Total Output 600  650   Net +466 -590          PHYSICAL EXAMINATION: General:  No ditress Neuro:  Awake, alert, oriented HEENT:  No JVD Cardiovascular:  Irregular, no murmurs Lungs:  Bilateral diminished air entry Abdomen:  Soft, bowel sounds normal Musculoskeletal:  No edema  LABS:  CBC  Recent Labs Lab 06/13/13 0800 06/14/13 0256 06/13/2013 0345  WBC 14.9* 12.8* 9.8  HGB 19.0* 18.1* 18.0*  HCT 55.2* 51.0 51.4  PLT 162 170 166   Coag's  Recent Labs Lab 06/14/2013 1220  APTT 77*  INR 1.42   BMET  Recent Labs Lab 06/14/13 1012 06/14/13 1630 05/18/2013 0345  NA 122* 122* 121*  K 4.9 5.2 5.2  CL 84* 83* 82*  CO2 $Re'20 22 21  'Kmb$ BUN 79* 86* 95*  CREATININE 2.62* 2.85* 2.76*  GLUCOSE 200* 228* 285*   Electrolytes  Recent Labs Lab 06/07/2013 0110  06/14/13 1012 06/14/13 1630 06/11/2013 0345  CALCIUM 9.2  < > 8.9 9.2 9.1  MG 2.1  --   --   --   --   < > = values in this interval not displayed.  Sepsis Markers No results found for this basename: LATICACIDVEN, PROCALCITON, O2SATVEN,  in the last 168 hours  ABG  Recent Labs Lab 06/14/2013 1424 06/11/2013 1436 06/14/13 1220  PHART 7.346* 7.306* 7.351  PCO2ART 41.9 53.6* 39.1  PO2ART 70.0* 33.0* 77.5*   Liver Enzymes  Recent Labs Lab  05/24/2013 0110  AST 95*  ALT 26  ALKPHOS 64  BILITOT 0.9  ALBUMIN 2.6*   Cardiac Enzymes  Recent Labs Lab 05/22/2013 2215 06/08/2013 0110 05/26/2013 0845  TROPONINI 14.56* 13.34* 7.92*   Glucose  Recent Labs Lab 06/14/13 0740 06/14/13 1136 06/14/13 1624 06/14/13 2123 06/13/2013 0743 06/08/2013 1202  GLUCAP 236* 196* 236* 292* 310* 294*   IMAGING:  Ct Chest Wo Contrast  06/14/2013   CLINICAL DATA:  Respiratory failure.  EXAM: CT CHEST WITHOUT CONTRAST  TECHNIQUE: Multidetector CT imaging of the chest was performed following the standard protocol without IV contrast.  COMPARISON:  Chest x-ray 06/08/2013.  FINDINGS: Thoracic aortic atherosclerotic vascular disease. No aneurysm.  Cardiomegaly. Coronary artery disease. Cardiomegaly is severe. Small pericardial effusion is present. Cardiac pacer noted with lead tips present in the right atrium, right ventricle, and coronary sinus.  Shotty mediastinal lymph nodes are noted. Thoracic esophagus is unremarkable.  Large airways are patent. Diffuse severe bullous COPD. Diffuse pulmonary interstitial prominence with bilateral pleural effusions. These findings are consistent with congestive heart failure. Pneumonitis cannot be excluded. Atelectasis in the lung bases.  Adrenals are normal. Ill-defined lucency in the right hepatic lobe. A primary or metastatic lesion could present in this fashion. Contrast-enhanced CT or MRI of the liver may prove useful for further evaluation.  Thyroid unremarkable. No significant axillary adenopathy. Chest wall is intact. Degenerative changes thoracic spine.  IMPRESSION: 1. Findings suggesting congestive heart failure with pulmonary interstitial edema and bilateral pleural effusions. Pneumonitis cannot be excluded. 2. Coronary artery disease. Severe cardiomegaly. Small pericardial effusion. Cardiac pacer. 3. Ill-defined lucency in the right hepatic lobe. Primary or metastatic malignancy could present in this fashion. Contrast-enhanced CT or MRI of the liver may prove useful.   Electronically Signed   By: Marcello Moores  Register   On: 06/14/2013 20:49   Dg Chest Port 1 View  05/27/2013   CLINICAL DATA:  Pulmonary infiltrates.  EXAM: PORTABLE CHEST - 1 VIEW  COMPARISON:  CT 06/14/2013.  Chest x-ray 05/24/2013 and 09/25/2011.  FINDINGS: Cardiomegaly. Cardiac pacer with lead tips in right atrium and right ventricle. Diffuse bilateral from interstitial prominence noted. This is chronic and consistent chronic interstitial lung disease. A mild active interstitial process cannot be excluded. Mild interstitial edema cannot be excluded. No pleural effusion or pneumothorax.  IMPRESSION: 1. Chronic interstitial lung disease.  Superimposed active interstitial lung disease cannot be excluded. 2. Severe cardiomegaly. This is stable. Cardiac pacer present. A mild component of pulmonary interstitial edema cannot be excluded .   Electronically Signed   By: Marcello Moores  Register   On: 06/04/2013 07:32   ASSESSMENT / PLAN:  PULMONARY A: Acute on chronic hypoxemic respiratory failure Pulmonary edema unlikely, clinically hypovolemic Possible miodarone toxicity  / ILD ? COPD, but no PFT available; no acute bronchospasm PE is unlikely while on anticoagulation Pulmonary hypertension P:   Goal SpO2>88 Supplemental oxygen Empirical Solu-Medrol 60 mg IV q12  ESR Change BD to Albuterol PRN  CARDIOVASCULAR A: CAD/ NSTEMI  Pulmonary hypertension Acute on chronic systolic congestive heart failure AF P:  Cardiology Avoid Amiodarone ASA, Lipitor, Imdur, Metoprolol, Digoxin  Milrinone gtt  RENAL A; Acute on chronic renal failure Hyponatremia  Hypovolemia P:   Trend BMP Hold diuresis  GASTROINTESTINAL A: GERD Ill-defined lucency in the right hepatic lobe P:   Protonix Liver MRI when stable  HEMATOLOGIC A: Chronic anticoagulation for AF P:  Trend CBC Xarelto  INFECTIOUS A: Doubt acute eifection P:   Defer abx  ENDOCRINE  A: DM Hyperglycemia, partly steroid-induced P:   SSI Lantus increase 30  NEUROLOGIC A: Anxiety / depression Chronic pain P:   Xanax Valium Cymbalta Ms Contin  I have personally obtained history, examined patient, evaluated and interpreted laboratory and imaging results, reviewed medical records, formulated assessment / plan and placed orders.  Doree Fudge, MD Pulmonary and [place] Pager: [phone]  06/09/2013, 1:39 PM

## 2013-06-15 NOTE — CV Procedure (Signed)
Cardiac Cath Procedure Note:  Indication:   Procedures performed:  1) Right heart catheterization  Description of procedure:   The risks and indication of the procedure were explained. Consent was signed and placed on the chart. An appropriate timeout was taken prior to the procedure. The right neck was prepped and draped in the routine sterile fashion and anesthetized with 1% local lidocaine.   A 7 FR venous sheath was placed in the right internal jugular vein using a modified Seldinger technique. A standard Swan-Ganz catheter was used for the procedure.   Complications: None apparent.  Findings:  On milrinone 0.144mcg/kg/min  RA = 19 RV = 83/13/19 PA = 86/37 (53) PCW = 25 Fick cardiac output/index = 5.1/2.3 PVR = 5.5 Woods FA sat = 93% PA sat = 69%, 70%  Assessment: 1. Severe pulmonary HTN due to both left and right sided components 2. Moderately elevated left-sided pressures with normal cardiac output on milrinone  Plan/Discussion:  Suspect his respiratory failure is multifactorial due to CHF and severe underlying lung disease. Cardiac output now normalized on milrinone. Will continue milrinone. Leave swan in. Attempt diuresis. Empiric course of IV steroids. Prognosis poor.  Jolaine Artist MD 4:31 PM

## 2013-06-15 NOTE — Progress Notes (Signed)
VASCULAR LAB PRELIMINARY  PRELIMINARY  PRELIMINARY  PRELIMINARY  Bilateral lower extremity venous duplex  completed.    Preliminary report:  Bilateral:  No evidence of DVT, superficial thrombosis, or Baker's Cyst.    Nani Ravens, RVT 06/14/2013, 1:25 PM

## 2013-06-15 NOTE — Progress Notes (Addendum)
Patient was not in and out bladder catheterized due to a 650 ml void.

## 2013-06-15 NOTE — Progress Notes (Signed)
Inpatient Diabetes Program Recommendations  AACE/ADA: New Consensus Statement on Inpatient Glycemic Control (2013)  Target Ranges:  Prepandial:   less than 140 mg/dL      Peak postprandial:   less than 180 mg/dL (1-2 hours)      Critically ill patients:  140 - 180 mg/dL  Results for KORREY, SCHLEICHER (MRN [id number]) as of 05/24/2013 10:50  Ref. Range 06/14/2013 07:40 06/14/2013 11:36 06/14/2013 16:24 06/14/2013 21:23 06/16/2013 07:43  Glucose-Capillary Latest Range: 70-99 mg/dL 236 (H) 196 (H) 236 (H) 292 (H) 310 (H)    Inpatient Diabetes Program Recommendations Insulin - Basal: consider increase Lantus to 25 units Correction (SSI): increase to resistant scale during steroid therapy Thank you  Raoul Pitch BSN, RN,CDE Inpatient Diabetes Coordinator [phone] (team pager)

## 2013-06-16 DIAGNOSIS — I5023 Acute on chronic systolic (congestive) heart failure: Secondary | ICD-10-CM

## 2013-06-16 DIAGNOSIS — I272 Pulmonary hypertension, unspecified: Secondary | ICD-10-CM

## 2013-06-16 DIAGNOSIS — N179 Acute kidney failure, unspecified: Secondary | ICD-10-CM

## 2013-06-16 LAB — BASIC METABOLIC PANEL
BUN: 111 mg/dL — AB (ref 6–23)
CO2: 21 mEq/L (ref 19–32)
Calcium: 9.3 mg/dL (ref 8.4–10.5)
Chloride: 86 mEq/L — ABNORMAL LOW (ref 96–112)
Creatinine, Ser: 2.73 mg/dL — ABNORMAL HIGH (ref 0.50–1.35)
GFR, EST AFRICAN AMERICAN: 23 mL/min — AB (ref >90)
GFR, EST NON AFRICAN AMERICAN: 20 mL/min — AB (ref >90)
Glucose, Bld: 345 mg/dL — ABNORMAL HIGH (ref 70–99)
POTASSIUM: 5.2 meq/L (ref 3.7–5.3)
Sodium: 124 mEq/L — ABNORMAL LOW (ref 137–147)

## 2013-06-16 LAB — CARBOXYHEMOGLOBIN
CARBOXYHEMOGLOBIN: 1.4 % (ref 0.5–1.5)
Methemoglobin: 0.5 % (ref 0.0–1.5)
O2 SAT: 73.6 %
Total hemoglobin: 18.4 g/dL — ABNORMAL HIGH (ref 13.5–18.0)

## 2013-06-16 LAB — CBC
HCT: 53.3 % — ABNORMAL HIGH (ref 39.0–52.0)
Hemoglobin: 17.7 g/dL — ABNORMAL HIGH (ref 13.0–17.0)
MCH: 30.9 pg (ref 26.0–34.0)
MCHC: 33.2 g/dL (ref 30.0–36.0)
MCV: 93.2 fL (ref 78.0–100.0)
PLATELETS: 230 10*3/uL (ref 150–400)
RBC: 5.72 MIL/uL (ref 4.22–5.81)
RDW: 15.4 % (ref 11.5–15.5)
WBC: 13.2 10*3/uL — ABNORMAL HIGH (ref 4.0–10.5)

## 2013-06-16 LAB — GLUCOSE, CAPILLARY
GLUCOSE-CAPILLARY: 324 mg/dL — AB (ref 70–99)
GLUCOSE-CAPILLARY: 299 mg/dL — AB (ref 70–99)
Glucose-Capillary: 416 mg/dL — ABNORMAL HIGH (ref 70–99)
Glucose-Capillary: 318 mg/dL — ABNORMAL HIGH (ref 70–99)

## 2013-06-16 MED ORDER — INSULIN ASPART 100 UNIT/ML ~~LOC~~ SOLN
0.0000 [IU] | Freq: Three times a day (TID) | SUBCUTANEOUS | Status: DC
  Administered 2013-06-16 (×2): 15 [IU] via SUBCUTANEOUS
  Administered 2013-06-16: 20 [IU] via SUBCUTANEOUS
  Administered 2013-06-17 (×2): 25 [IU] via SUBCUTANEOUS

## 2013-06-16 MED ORDER — MILRINONE IN DEXTROSE 20 MG/100ML IV SOLN
0.2500 ug/kg/min | INTRAVENOUS | Status: DC
  Administered 2013-06-17 (×2): 0.25 ug/kg/min via INTRAVENOUS
  Filled 2013-06-16 (×2): qty 100

## 2013-06-16 MED ORDER — INSULIN ASPART 100 UNIT/ML ~~LOC~~ SOLN
0.0000 [IU] | Freq: Every day | SUBCUTANEOUS | Status: DC
  Administered 2013-06-16: 3 [IU] via SUBCUTANEOUS

## 2013-06-16 NOTE — Progress Notes (Signed)
ANTICOAGULATION CONSULT NOTE - Initial Consult  Pharmacy Consult for Xarelto Indication: atrial fibrillation  Allergies  Allergen Reactions  . Celecoxib   . Iodine   . Penicillins   . Sulfa Antibiotics   . Zolpidem Tartrate     Patient Measurements: Height: 6' (182.9 cm) Weight: 231 lb 7.7 oz (105 kg) IBW/kg (Calculated) : 77.6  Vital Signs: Temp: 97.2 F (36.2 C) (05/30 0800) Temp src: Core (Comment) (05/30 0733) BP: 155/57 mmHg (05/30 0800) Pulse Rate: 97 (05/30 0800)  Labs:  Recent Labs  06/14/13 0256  06/14/13 1630 05/22/2013 0345 06/16/13 0509  HGB 18.1*  --   --  18.0* 17.7*  HCT 51.0  --   --  51.4 53.3*  PLT 170  --   --  166 230  CREATININE  --   < > 2.85* 2.76* 2.73*  < > = values in this interval not displayed.  Estimated Creatinine Clearance: 25.7 ml/min (by C-G formula based on Cr of 2.73).   Medical History: Past Medical History  Diagnosis Date  . Chronic systolic heart failure     LVEF 20% up to 50%  . ICD (implantable cardiac defibrillator) in place     RV lead fractured (7829 fidelis lead) and therefore now programmed with VT/VF therapies off  . Ischemic cardiomyopathy   . Atrial fibrillation   . Mitral regurgitation   . Coronary atherosclerosis of native coronary artery     Multiple percutaneous coronary intervention in the past last cath in 07/2004: totally occluded RCA with left to right collaterals ,and a 70-75% mid to distal obtuse marginal stenosis medical therapy  . Essential hypertension, benign   . Hyperlipidemia   . COPD (chronic obstructive pulmonary disease)     O2 at home  . GERD (gastroesophageal reflux disease)   . Type 2 diabetes mellitus   . Sleep apnea   . Anemia   . Dementia     Medications:  Scheduled:  . acetaminophen  500 mg Oral Q12H  . antiseptic oral rinse  15 mL Mouth Rinse BID  . aspirin  81 mg Oral Daily  . atorvastatin  80 mg Oral Daily  . collagenase   Topical Daily  . DULoxetine  60 mg Oral Q24H  .  fluticasone  2 spray Each Nare Daily  . furosemide  80 mg Intravenous BID  . gabapentin  100 mg Oral Q24H  . insulin aspart  0-20 Units Subcutaneous TID WC  . insulin aspart  0-5 Units Subcutaneous QHS  . insulin glargine  30 Units Subcutaneous QHS  . levothyroxine  100 mcg Oral QAC breakfast  . loratadine  10 mg Oral Daily  . methylPREDNISolone (SOLU-MEDROL) injection  60 mg Intravenous Q6H  . morphine  30 mg Oral TID  . multivitamin  1 tablet Oral QHS  . pantoprazole  40 mg Oral Daily  . rivaroxaban  15 mg Oral Q supper  . sodium chloride  3 mL Intravenous Q12H  . sodium chloride  3 mL Intravenous Q12H  . sodium chloride  3 mL Intravenous Q12H  . sodium chloride  3 mL Intravenous Q12H   Infusions:  . milrinone 0.125 mcg/kg/min (06/09/2013 1431)    Assessment: 80 yo M (on PTA Xarelto for afib) originally presented with SOB and weakness to [facility] where heparin IV gtt was started for ACS and elevated troponins. Heparin was continued at [facility] for NSTEMI, but now patient is s/p cath and pharmacy consulted to transition patient back to Xarelto PO.  Hgb is stable at 17.7, and plt are trending up to 230 with no reported s/s bleeding. SCr remains elevated at 2.73 with CrCl ~25.   Goal of Therapy:  Anticoagulation with adherence, stroke prevention Monitor platelets by anticoagulation protocol: Yes   Plan:  - Cont Xarelto 15 mg PO daily with supper - Monitor kidney function and possible need to adjust dose if improved - Monitor for s/s of bleeding  Harolyn Rutherford, PharmD Clinical Pharmacist - Resident Pager: [phone] Pharmacy: [phone] 06/16/2013 9:59 AM

## 2013-06-16 NOTE — Progress Notes (Signed)
Clinical Education officer, museum (CSW) left message with patient's wife to see if she has contact information for son. CSW will continue to follow and assist as needed.   Blima Rich, [place] Weekend CSW [phone]

## 2013-06-16 NOTE — Progress Notes (Signed)
SUBJECTIVE:   [name] is a 80 y.o. male with a history of CAD, atrial fibrillation on Xarelto and biventricular ICD, COPD (on home oxygen but non-compliant), dCHF presented initially to [place] on 05/28/2013 for dyspnea, found to have elevated troponin >30 and transferred to [facility] for NSTEMI. Cardiac enzymes here 14>>13>>7.92. ECG atrial fibrillation with predominant BiV pacing.   S/p L and R HC 5/26 with diffuse small to moderate branch OM and diagonal disease with occlusion of the RCA with left to right collaterals, cardiomyopathy out of proportion with the extent of CAD, and moderate Pulmonary Venous Hypertension. RA press =13/13, RV 60/10, PA 63/28, PCWP 20/21, LV 107/14. CO/CI 3.11/1.4 by Fick method and 2.87/1.29 by thermodilution. Echo 5/26: EF 30-35%, akinesis of the basal-mid inferior myocardium. PA pressure 36 mmHg. Of note, prior echo 07/2011 EF 50-55%.  CT chest was done 5/28: pulmonary interstitial edema and bilateral effusions with severe cardiomegaly. Also reported a right hepatic lobe mass ? Malignancy.  Pulmonary and CHF team are following along as well.   Luiz Blare was placed 05/27/2013 found to have severe pulmonary HTN and moderately elevated L sided pressures with normal CO on milrinone:  On milrinone 0.157mcg/kg/min  RA = 19  RV = 83/13/19  PA = 86/37 (53)  PCW = 25  Fick cardiac output/index = 5.1/2.3  PVR = 5.5 Woods  FA sat = 93%  PA sat = 69%, 70%    Since admission he continues to have high oxygen demands, decreased urine output, and worsening renal failure.  Amiodarone was discontinued 06/01/2013 for concern of possible toxicity and Milrinone was started.  He was initially being diuresed but discontinued given worsening renal function and persistent hyponatremia.    Today, his swan remains in placed.  He is very sleepy today.  He is continued on milrinone and was started on IV steroids yesterday.  Sleepy this morning, denies any complaints. Still requiring NRB. Runs  of Vtach on tele.    Marland Kitchen acetaminophen  500 mg Oral Q12H  . antiseptic oral rinse  15 mL Mouth Rinse BID  . aspirin  81 mg Oral Daily  . atorvastatin  80 mg Oral Daily  . collagenase   Topical Daily  . DULoxetine  60 mg Oral Q24H  . fluticasone  2 spray Each Nare Daily  . furosemide  80 mg Intravenous BID  . gabapentin  100 mg Oral Q24H  . insulin aspart  0-15 Units Subcutaneous TID WC & HS  . insulin glargine  30 Units Subcutaneous QHS  . levothyroxine  100 mcg Oral QAC breakfast  . loratadine  10 mg Oral Daily  . methylPREDNISolone (SOLU-MEDROL) injection  60 mg Intravenous Q6H  . morphine  30 mg Oral TID  . multivitamin  1 tablet Oral QHS  . pantoprazole  40 mg Oral Daily  . rivaroxaban  15 mg Oral Q supper  . sodium chloride  3 mL Intravenous Q12H  . sodium chloride  3 mL Intravenous Q12H  . sodium chloride  3 mL Intravenous Q12H  . sodium chloride  3 mL Intravenous Q12H   . milrinone 0.125 mcg/kg/min (06/04/2013 1431)   PHYSICAL EXAM Filed Vitals:   06/16/13 0400 06/16/13 0500 06/16/13 0600 06/16/13 0733  BP: 133/54 146/47 134/57 143/49  Pulse: 94 96 86 90  Temp: 97 F (36.1 C) 97.2 F (36.2 C) 97.2 F (36.2 C)   TempSrc:    Core (Comment)  Resp: 14 20 9 7   Height:  Weight:  231 lb 7.7 oz (105 kg)    SpO2: 93% 97% 91% 91%   General: A&OX3. No acute distress, sleeping, on NRB Neck: No carotid bruits, R dressing in place Lungs: Clear b/l, shallow respirations Heart: irregular rate and rhythm with S1 S2. 2/6 murmur Abdomen: Soft, non-tender, distended with +bs Msk: moves all extremities.  Extremities: +2 pitting edema b/l Skin: dry  LABS: Lab Results  Component Value Date   TROPONINI 7.92* 05/25/2013   Results for orders placed during the hospital encounter of 06/10/2013 (from the past 24 hour(s))  GLUCOSE, CAPILLARY     Status: Abnormal   Collection Time    06/04/2013 12:02 PM      Result Value Ref Range   Glucose-Capillary 294 (*) 70 - 99 mg/dL   SEDIMENTATION RATE     Status: None   Collection Time    06/02/2013  2:35 PM      Result Value Ref Range   Sed Rate 8  0 - 16 mm/hr  POCT I-STAT 3, VENOUS BLOOD GAS (G3P V)     Status: Abnormal   Collection Time    06/14/2013  4:28 PM      Result Value Ref Range   pH, Ven 7.240 (*) 7.250 - 7.300   pCO2, Ven 59.0 (*) 45.0 - 50.0 mmHg   pO2, Ven 44.0  30.0 - 45.0 mmHg   Bicarbonate 25.3 (*) 20.0 - 24.0 mEq/L   TCO2 27  0 - 100 mmol/L   O2 Saturation 70.0     Acid-base deficit 4.0 (*) 0.0 - 2.0 mmol/L   Sample type VENOUS    POCT I-STAT 3, VENOUS BLOOD GAS (G3P V)     Status: Abnormal   Collection Time    05/22/2013  4:29 PM      Result Value Ref Range   pH, Ven 7.238 (*) 7.250 - 7.300   pCO2, Ven 60.8 (*) 45.0 - 50.0 mmHg   pO2, Ven 43.0  30.0 - 45.0 mmHg   Bicarbonate 25.9 (*) 20.0 - 24.0 mEq/L   TCO2 28  0 - 100 mmol/L   O2 Saturation 69.0     Acid-base deficit 4.0 (*) 0.0 - 2.0 mmol/L   Sample type VENOUS     Comment NOTIFIED PHYSICIAN    GLUCOSE, CAPILLARY     Status: Abnormal   Collection Time    06/16/2013  6:13 PM      Result Value Ref Range   Glucose-Capillary 301 (*) 70 - 99 mg/dL  GLUCOSE, CAPILLARY     Status: Abnormal   Collection Time    06/01/2013  9:29 PM      Result Value Ref Range   Glucose-Capillary 310 (*) 70 - 99 mg/dL  CARBOXYHEMOGLOBIN     Status: Abnormal   Collection Time    06/16/13  5:05 AM      Result Value Ref Range   Total hemoglobin 18.4 (*) 13.5 - 18.0 g/dL   O2 Saturation 73.6     Carboxyhemoglobin 1.4  0.5 - 1.5 %   Methemoglobin 0.5  0.0 - 1.5 %  CBC     Status: Abnormal   Collection Time    06/16/13  5:09 AM      Result Value Ref Range   WBC 13.2 (*) 4.0 - 10.5 K/uL   RBC 5.72  4.22 - 5.81 MIL/uL   Hemoglobin 17.7 (*) 13.0 - 17.0 g/dL   HCT 53.3 (*) 39.0 - 52.0 %   MCV 93.2  78.0 - 100.0 fL   MCH 30.9  26.0 - 34.0 pg   MCHC 33.2  30.0 - 36.0 g/dL   RDW 15.4  11.5 - 15.5 %   Platelets 230  150 - 400 K/uL  BASIC METABOLIC PANEL      Status: Abnormal   Collection Time    06/16/13  5:09 AM      Result Value Ref Range   Sodium 124 (*) 137 - 147 mEq/L   Potassium 5.2  3.7 - 5.3 mEq/L   Chloride 86 (*) 96 - 112 mEq/L   CO2 21  19 - 32 mEq/L   Glucose, Bld 345 (*) 70 - 99 mg/dL   BUN 111 (*) 6 - 23 mg/dL   Creatinine, Ser 2.73 (*) 0.50 - 1.35 mg/dL   Calcium 9.3  8.4 - 10.5 mg/dL   GFR calc non Af Amer 20 (*) >90 mL/min   GFR calc Af Amer 23 (*) >90 mL/min  GLUCOSE, CAPILLARY     Status: Abnormal   Collection Time    06/16/13  7:39 AM      Result Value Ref Range   Glucose-Capillary 416 (*) 70 - 99 mg/dL    Intake/Output Summary (Last 24 hours) at 06/16/13 0843 Last data filed at 06/16/13 0600  Gross per 24 hour  Intake 735.21 ml  Output   1450 ml  Net -714.79 ml   ASSESSMENT AND PLAN:  Principal Problem:   NSTEMI (non-ST elevated myocardial infarction) Active Problems:   HYPERLIPIDEMIA-MIXED   Essential hypertension, benign   CARDIOMYOPATHY, ISCHEMIC   COPD   Thrombocytosis   CHF (congestive heart failure)   Hypoxia   Hyponatremia   Liver mass, right lobe   Cardiogenic shock   Acute respiratory failure   Acute renal failure   CAD with NSTEMI--cath 07/2004 cath with occluded RCA and collaterals). Denies chest pain or SOB today. Trop trended down 14>13>7.92 this admission. L and R Cath 5/26 without acute occlusion but with Diffuse small to moderate branch OM and diagonal disease with occlusion of the RCA with left to right collaterals. Nothing treatable. Low CO/CI noted on [facility].  - continue xarelto, ASA, atorvastatin 80 mg daily - appreciate CHF team following - remains on milrinone, will increase dose today  Hypoxic respiratory failure: acute vs chronic, multifactorial in setting of CHF and lung disease. Patient has reported history of COPD but there is no documentation of PFTs.  Apparently uses oxygen at home but likely non-compliant. He continues to require high oxygen with NRB at 15L/min. Chest ct  scan 06/14/2013 revealed pulmonary edema and bilateral pleural effusions. Amiodarone toxicity has been considered and therefore it has been discontinued.    - appreciate pulmonary and HF teams following - lasix restarted yesterday along with IV steroids - albuterol nebz prn  Acute on chronic diastolic CHF: EF 30-16% (improved) in 2013. Worse on repeat echo with EF 30-35%. High oxygen demand. Noncompliant with home O2 for COPD. Wt up ~10 pounds since admisison, now 231lb's despite diuresis. Stable Cr since yesterday 2.7. S/p swan placement 06/07/2013 with noted severe pulmonary HTN and moderately elevated L sided pressures. Normal cardiac output on milrinone.   - back on lasix 80mg  iv bid - continue milrinone - slightly improved urine output yesterday, 1.45L with net +60.76mL since admission.  - Holding ARB in setting of renal failure - Continue Toprol XL  Atrial fibrillation: History of PAF. On home amiodarone. Atrial fibrillation will make his BiV pacing much less efficient and  may be contributing to current CHF exacerbation.   - repeat EKG this AM, several runs of VTACH on tele - now off Amiodarone due to possible lung toxicity  - on Xarelto - BB was discontinued yesterday  Hyponatremia: slightly improved today.  In setting of CHF and hypervolemia in addition to diuresis and decrease urine output. Urine sodium <20 and Urine osm 348 showing possible inappropriate concentration. ?in setting of malignancy with ?lucency in R hepatic lobe per imaging.   Recent Labs Lab 06/13/13 0800 06/14/13 1012 06/14/13 1630 05/19/2013 0345 06/16/13 0509  NA 131* 122* 122* 121* 124*   - continue diuresis for now - trend sodium  Acute on chronic renal failure--likely ATN in setting of NSTEMI, acute on chronic CHF and respiratory failure.  Cr slightly trending down over the past couple of days and stable since yesterday ~2.7. Lasix restarted yesterday with slight improvement in urine output. Cr 1.4 in 2011  and 1.77 on admission.   Recent Labs Lab 06/13/13 0800 06/14/13 1012 06/14/13 1630 05/31/2013 0345 06/16/13 0509  CREATININE 1.87* 2.62* 2.85* 2.76* 2.73*   - consider renal ultrasound and may need renal consult involvement but likely would not be a good candidate for HD - continue to diuresis with caution of renal function, BUN continues to rise, now 111 - monitor urine output, +67.4mL this admission, ~1.4L output yesterday, no urine output yet, will bladder scan to assess for retention, may need to place foley back in  DM2, uncontrolled: HbA1C 8.6.  CBGs remain elevated, up to 416 this morning, worsened in setting of steroid use.  Home regimen NPH 55 units bid and novolin R 7-10 units bid.  - on Lantus 30 units at this time, will need to increase given persistent hyperglycemia - change SSI moderate to resistant and add HS scale - may need meal time coverage as well given recent steroid use if remains elevated  Leukocytosis: trended back up today to 13.2 in setting of initiation of steroids. Afebrile. Chronic interstitial lung disease with superimposed active lung disease? Per cxr 5/29.   Chronic back pain - cont with home MS-contin  Liver mass: right hepatic mass noted on CT scan, primary or metastatic malignancy?. Radiology recommended MRI of abdomen. CT with contrast will not be pursued due to renal function -pending further clinical improvement  ?dementia Code status: Full code placed yesterday. Today, whole family is in the room, discussed advance directives with family again today.  Will proceed with DNR/DNI at this time. They are interested in palliative care and will consult their team today for further assistance.   Case discussed and patient seen with Dr. Aundra Dubin  Signed: Jerene Pitch, MD PGY-2, Internal Medicine Resident Pager: [phone]  06/16/2013,9:23 AM  Patient seen with resident, agree with the above note.  Today, CVP 10 and PCWP 28.  CI adequate on milrinone at  2.9.  He is confused this morning, BUN up to 111.  Very little urine output.  I had a discussion with family today.  Prognosis is grim. Today, I will increase milrinone to 0.25 to see if it makes any difference.  Hold Lasix for now with rising creatinine/BUN.  He will be DNR/DNI.  We will have family meet with palliative care tomorrow morning with likely transition to comfort measures only.   Larey Dresser 06/16/2013 12:15 PM

## 2013-06-17 DIAGNOSIS — E1165 Type 2 diabetes mellitus with hyperglycemia: Secondary | ICD-10-CM | POA: Diagnosis present

## 2013-06-17 DIAGNOSIS — IMO0002 Reserved for concepts with insufficient information to code with codable children: Secondary | ICD-10-CM | POA: Diagnosis present

## 2013-06-17 DIAGNOSIS — E875 Hyperkalemia: Secondary | ICD-10-CM | POA: Diagnosis not present

## 2013-06-17 LAB — BASIC METABOLIC PANEL
BUN: 148 mg/dL — ABNORMAL HIGH (ref 6–23)
CO2: 20 mEq/L (ref 19–32)
Calcium: 9.2 mg/dL (ref 8.4–10.5)
Chloride: 87 mEq/L — ABNORMAL LOW (ref 96–112)
Creatinine, Ser: 3.01 mg/dL — ABNORMAL HIGH (ref 0.50–1.35)
GFR calc Af Amer: 21 mL/min — ABNORMAL LOW (ref >90)
GFR calc non Af Amer: 18 mL/min — ABNORMAL LOW (ref >90)
Glucose, Bld: 383 mg/dL — ABNORMAL HIGH (ref 70–99)
Potassium: 5.6 mEq/L — ABNORMAL HIGH (ref 3.7–5.3)
Sodium: 127 mEq/L — ABNORMAL LOW (ref 137–147)

## 2013-06-17 LAB — CBC
HCT: 48.8 % (ref 39.0–52.0)
Hemoglobin: 16.8 g/dL (ref 13.0–17.0)
MCH: 31.4 pg (ref 26.0–34.0)
MCHC: 34.4 g/dL (ref 30.0–36.0)
MCV: 91.2 fL (ref 78.0–100.0)
Platelets: 214 10*3/uL (ref 150–400)
RBC: 5.35 MIL/uL (ref 4.22–5.81)
RDW: 15.1 % (ref 11.5–15.5)
WBC: 17.6 10*3/uL — ABNORMAL HIGH (ref 4.0–10.5)

## 2013-06-17 LAB — CARBOXYHEMOGLOBIN
CARBOXYHEMOGLOBIN: 1.7 % — AB (ref 0.5–1.5)
METHEMOGLOBIN: 0.7 % (ref 0.0–1.5)
O2 Saturation: 66.6 %
TOTAL HEMOGLOBIN: 16.2 g/dL (ref 13.5–18.0)

## 2013-06-17 LAB — GLUCOSE, RANDOM: Glucose, Bld: 443 mg/dL — ABNORMAL HIGH (ref 70–99)

## 2013-06-17 LAB — GLUCOSE, CAPILLARY
GLUCOSE-CAPILLARY: 475 mg/dL — AB (ref 70–99)
Glucose-Capillary: 495 mg/dL — ABNORMAL HIGH (ref 70–99)
Glucose-Capillary: 426 mg/dL — ABNORMAL HIGH (ref 70–99)

## 2013-06-17 MED ORDER — INSULIN ASPART 100 UNIT/ML ~~LOC~~ SOLN
25.0000 [IU] | Freq: Once | SUBCUTANEOUS | Status: AC
  Administered 2013-06-17: 25 [IU] via SUBCUTANEOUS

## 2013-06-17 MED ORDER — INSULIN ASPART 100 UNIT/ML ~~LOC~~ SOLN: 25.0000 [IU] | Freq: Once | SUBCUTANEOUS | Status: DC

## 2013-06-17 MED ORDER — MORPHINE SULFATE 2 MG/ML IJ SOLN
1.0000 mg | INTRAMUSCULAR | Status: DC | PRN
Start: 2013-06-17 — End: 2013-06-18
  Administered 2013-06-17 (×2): 1 mg via INTRAVENOUS
  Filled 2013-06-17 (×2): qty 1

## 2013-06-17 MED ORDER — LORAZEPAM 2 MG/ML IJ SOLN: 0.2500 mg | INTRAMUSCULAR | Status: DC | PRN

## 2013-06-17 MED ORDER — DIAZEPAM 5 MG/ML IJ SOLN
2.5000 mg | Freq: Four times a day (QID) | INTRAMUSCULAR | Status: DC | PRN
  Administered 2013-06-17: 2.5 mg via INTRAVENOUS
  Filled 2013-06-17: qty 2

## 2013-06-18 NOTE — Consult Note (Signed)
Patient [name]      DOB: 26-Mar-1927      NVB:[id number]  Summary of Goals of Care; full note to follow:  Met with Olegario Shearer , daughter Darnelle Catalan  Family trying to get Son [name] home from [place], in the [place] understands that [name] is dying.   They would like to keep status quo with meds, no escalation for one more day to see if the [name] can get home.  They are ok with prn morphine for dyspnea and moaning. They would be open to weaning milrinone even if it means further decline.  Recommend:  1. DNR  2.  Changed xanxax to iv diazepam low dose prn  3.  Changed MS contin to IV morphine 1 mg q 1 hr.  Would have low threshold for starting a drip if he had any further decline respiratory failure. Discussed with family they are ok with this.  Time 1135-1225  Romelle Muldoon L. Lovena Le, MD MBA The Palliative Medicine Team at [facility] Phone: [phone] Pager: [phone]

## 2013-06-18 NOTE — Progress Notes (Signed)
Patient [name]      DOB: November 30, 1927      WUJ:[id number]  Confirmed will meet with family 37 today 05/29/2013 for goals of care meeting.  Coltyn Hanning L. Lovena Le, MD MBA The Palliative Medicine Team at [facility] Phone: [phone] Pager: [phone]

## 2013-06-18 NOTE — Progress Notes (Signed)
Pt became bradycardic, O2 sats and respirations decreased. RN to beside. Upon assessment pt was agonally breathing, pale, and pulses were not palpable. Pt's wife informed that pt was actively dying and encouraged her to hold pt's hand. Asystole noted at 05-14-30. Two RN's listened for heart tones for one full minute with none assessed. Time of death declared at 2030/05/14. Chaplain paged and now at bedside. Cardiology Fellow paged and informed of pt's TOD.

## 2013-06-18 NOTE — Progress Notes (Signed)
SUBJECTIVE:   Mr. Meleski is a 80 y.o. male with a history of CAD, atrial fibrillation on Xarelto and biventricular ICD, COPD (on home oxygen but non-compliant), dCHF presented initially to [place] on 06/10/2013 for dyspnea, found to have elevated troponin >30 and transferred to [facility] for NSTEMI. Cardiac enzymes here 14>>13>>7.92. ECG atrial fibrillation with predominant BiV pacing.   S/p L and R HC 5/26 with diffuse small to moderate branch OM and diagonal disease with occlusion of the RCA with left to right collaterals, cardiomyopathy out of proportion with the extent of CAD, and moderate Pulmonary Venous Hypertension. RA press =13/13, RV 60/10, PA 63/28, PCWP 20/21, LV 107/14. CO/CI 3.11/1.4 by Fick method and 2.87/1.29 by thermodilution. Echo 5/26: EF 30-35%, akinesis of the basal-mid inferior myocardium. PA pressure 36 mmHg. Of note, prior echo 07/2011 EF 50-55%.  CT chest was done 5/28: pulmonary interstitial edema and bilateral effusions with severe cardiomegaly. Also reported a right hepatic lobe mass ? Malignancy.  Pulmonary and CHF team are following along as well.   Luiz Blare was placed 05/24/2013 found to have severe pulmonary HTN and moderately elevated L sided pressures with normal CO on milrinone:  On milrinone 0.172mcg/kg/min  RA = 19  RV = 83/13/19  PA = 86/37 (53)  PCW = 25  Fick cardiac output/index = 5.1/2.3  PVR = 5.5 [place]  FA sat = 93%  PA sat = 69%, 70%    Since admission he continues to have high oxygen demands, decreased urine output, and worsening renal failure.  Amiodarone was discontinued 05/27/2013 for concern of possible toxicity and Milrinone was started.  He was initially being diuresed but discontinued given worsening renal function and persistent hyponatremia.    Today, his swan remains in place, sleeping, difficult to arouse, moans occasionally, not opening eyes or responding to commands, worsening renal function.  Was not able to eat yesterday.  Urine output  remains low and still on NRB. CBGs elevated.  Goals of care meeting scheduled for 1130am with palliative team.     . acetaminophen  500 mg Oral Q12H  . antiseptic oral rinse  15 mL Mouth Rinse BID  . aspirin  81 mg Oral Daily  . atorvastatin  80 mg Oral Daily  . collagenase   Topical Daily  . DULoxetine  60 mg Oral Q24H  . fluticasone  2 spray Each Nare Daily  . gabapentin  100 mg Oral Q24H  . insulin aspart  0-20 Units Subcutaneous TID WC  . insulin aspart  0-5 Units Subcutaneous QHS  . insulin glargine  30 Units Subcutaneous QHS  . levothyroxine  100 mcg Oral QAC breakfast  . loratadine  10 mg Oral Daily  . methylPREDNISolone (SOLU-MEDROL) injection  60 mg Intravenous Q6H  . morphine  30 mg Oral TID  . multivitamin  1 tablet Oral QHS  . pantoprazole  40 mg Oral Daily  . rivaroxaban  15 mg Oral Q supper   . milrinone 0.25 mcg/kg/min (05/21/2013 0351)   PHYSICAL EXAM Filed Vitals:   05/25/2013 0500 06/14/2013 0600 06/04/2013 0700 06/07/2013 0800  BP: 138/49 139/39 152/66 131/50  Pulse: 106 105 108 107  Temp: 98.8 F (37.1 C) 99 F (37.2 C) 99.3 F (37.4 C) 99.7 F (37.6 C)  TempSrc:      Resp: 9 9 11 15   Height:      Weight: 229 lb 15 oz (104.3 kg)     SpO2: 94% 94% 95% 94%   General: A&OX3.  No acute distress, sleeping, on NRB Neck: No carotid bruits, R dressing in place Lungs: Clear b/l, shallow respirations Heart: irregular rate and rhythm with S1 S2. 2/6 murmur Abdomen: Soft, non-tender, distended with +bs Msk: moves all extremities.  Extremities: +2 pitting edema b/l Skin: dry Neuro: difficult to arouse, occasional moans, not responding to commands  LABS: Lab Results  Component Value Date   TROPONINI 7.92* 06/13/2013   Results for orders placed during the hospital encounter of 05/26/2013 (from the past 24 hour(s))  GLUCOSE, CAPILLARY     Status: Abnormal   Collection Time    06/16/13 11:39 AM      Result Value Ref Range   Glucose-Capillary 318 (*) 70 - 99 mg/dL    GLUCOSE, CAPILLARY     Status: Abnormal   Collection Time    06/16/13  4:52 PM      Result Value Ref Range   Glucose-Capillary 324 (*) 70 - 99 mg/dL  GLUCOSE, CAPILLARY     Status: Abnormal   Collection Time    06/16/13  9:58 PM      Result Value Ref Range   Glucose-Capillary 299 (*) 70 - 99 mg/dL  CBC     Status: Abnormal   Collection Time    07/12/2013  5:00 AM      Result Value Ref Range   WBC 17.6 (*) 4.0 - 10.5 K/uL   RBC 5.35  4.22 - 5.81 MIL/uL   Hemoglobin 16.8  13.0 - 17.0 g/dL   HCT 48.8  39.0 - 52.0 %   MCV 91.2  78.0 - 100.0 fL   MCH 31.4  26.0 - 34.0 pg   MCHC 34.4  30.0 - 36.0 g/dL   RDW 15.1  11.5 - 15.5 %   Platelets 214  150 - 400 K/uL  BASIC METABOLIC PANEL     Status: Abnormal   Collection Time    Jul 12, 2013  5:00 AM      Result Value Ref Range   Sodium 127 (*) 137 - 147 mEq/L   Potassium 5.6 (*) 3.7 - 5.3 mEq/L   Chloride 87 (*) 96 - 112 mEq/L   CO2 20  19 - 32 mEq/L   Glucose, Bld 383 (*) 70 - 99 mg/dL   BUN 148 (*) 6 - 23 mg/dL   Creatinine, Ser 3.01 (*) 0.50 - 1.35 mg/dL   Calcium 9.2  8.4 - 10.5 mg/dL   GFR calc non Af Amer 18 (*) >90 mL/min   GFR calc Af Amer 21 (*) >90 mL/min  CARBOXYHEMOGLOBIN     Status: Abnormal   Collection Time    07/12/13  5:39 AM      Result Value Ref Range   Total hemoglobin 16.2  13.5 - 18.0 g/dL   O2 Saturation 66.6     Carboxyhemoglobin 1.7 (*) 0.5 - 1.5 %   Methemoglobin 0.7  0.0 - 1.5 %  GLUCOSE, CAPILLARY     Status: Abnormal   Collection Time    07/12/13  8:16 AM      Result Value Ref Range   Glucose-Capillary 475 (*) 70 - 99 mg/dL    Intake/Output Summary (Last 24 hours) at 2013-07-12 0827 Last data filed at 07/12/13 0800  Gross per 24 hour  Intake 616.28 ml  Output   1475 ml  Net -858.72 ml   ASSESSMENT AND PLAN:  Principal Problem:   NSTEMI (non-ST elevated myocardial infarction) Active Problems:   HYPERLIPIDEMIA-MIXED   Essential hypertension, benign  CARDIOMYOPATHY, ISCHEMIC   COPD   CHF  (congestive heart failure)   Hypoxia   Hyponatremia   Liver mass, right lobe   Cardiogenic shock   Acute respiratory failure   Acute renal failure   Pulmonary hypertension   CAD with NSTEMI--cath 07/2004 cath with occluded RCA and collaterals). Denies chest pain or SOB today. Trop trended down 14>13>7.92 this admission. L and R Cath 5/26 without acute occlusion but with Diffuse small to moderate branch OM and diagonal disease with occlusion of the RCA with left to right collaterals. Nothing treatable. Low CO/CI noted on [facility].  - continue xarelto, ASA, atorvastatin 80 mg daily - appreciate CHF team following - remains on milrinone  Hypoxic respiratory failure: acute vs chronic, multifactorial in setting of CHF and lung disease. Patient has reported history of COPD but there is no documentation of PFTs.  Apparently uses oxygen at home but likely non-compliant. He continues to require high oxygen with NRB at 15L/min. Chest ct scan 06/14/2013 revealed pulmonary edema and bilateral pleural effusions. Amiodarone toxicity has been considered and therefore it has been discontinued.    - appreciate pulmonary and HF teams following - lasix restarted yesterday along with IV steroids, will d/c steroids given no improvement in respiratory status and worsening hyperglycemia - albuterol nebz prn  Acute on chronic diastolic CHF: EF 40-98% (improved) in 2013. Worse on repeat echo with EF 30-35%. High oxygen demand. Noncompliant with home O2 for COPD. Wt up ~10 pounds since admisison, now 231lb's despite diuresis. Stable Cr since yesterday 2.7. S/p swan placement 05/27/2013 with noted severe pulmonary HTN and moderately elevated L sided pressures. Normal cardiac output on milrinone.   - back on lasix 80mg  iv bid - continue milrinone - slightly improved urine output yesterday, 1.45L with net +60.65mL since admission.  - Holding ARB in setting of renal failure - Continue Toprol XL  Atrial fibrillation: History of  PAF. On home amiodarone. Atrial fibrillation will make his BiV pacing much less efficient and may be contributing to current CHF exacerbation.   - repeat EKG this AM, several runs of VTACH on tele - now off Amiodarone due to possible lung toxicity  - on Xarelto - BB was discontinued yesterday  Hyponatremia: slightly improved today.  In setting of CHF and hypervolemia in addition to diuresis and decrease urine output. Urine sodium <20 and Urine osm 348 showing possible inappropriate concentration. ?in setting of malignancy with ?lucency in R hepatic lobe per imaging.   Recent Labs Lab 06/14/13 1012 06/14/13 1630 06/07/2013 0345 06/16/13 0509 05/23/2013 0500  NA 122* 122* 121* 124* 127*   - holding diuresis - trend sodium  Acute on chronic renal failure with uremia--likely ATN in setting of NSTEMI, acute on chronic CHF and respiratory failure.  Cr worsening to 3.01 today. Lasix held yesterday and urine output remains low. Weight up 8 pounds since admission. Net neg ~844ml since admission. Cr 1.4 in 2011 and 1.77 on admission. BUN up to 148.  Recent Labs Lab 06/14/13 1012 06/14/13 1630 05/24/2013 0345 06/16/13 0509 05/20/2013 0500  CREATININE 2.62* 2.85* 2.76* 2.73* 3.01*  -goals of care discussion today - foley in place - monitor urine output  Hyperkalemia--K 5.6 today -check EKG -will given insulin  DM2, uncontrolled: HbA1C 8.6.  CBGs remain elevated, 400's this morning.  Worsened in setting of steroid use.  Home regimen NPH 55 units bid and novolin R 7-10 units bid.  - on Lantus 30 units at this time, may increase given persistent  hyperglycemia and steroid use - SSI resistant and HS scale - pending goals of care discussion, will consider adding meal time coverage  Leukocytosis: trending up in setting of initiation of steroids. Afebrile. Chronic interstitial lung disease with superimposed active lung disease? Per cxr 5/29.   Chronic back pain - cont with home MS-contin  Liver  mass: right hepatic mass noted on CT scan, primary or metastatic malignancy?. Radiology recommended MRI of abdomen. CT with contrast will not be pursued due to renal function -pending further clinical improvement  ?dementia Code status: DNR/DNI. Goals of care this morning with palliative team.   Case discussed and patient seen with Dr. Aundra Dubin  Signed: Jerene Pitch, MD PGY-2, Internal Medicine Resident Pager: [phone]   Patient seen with resident, agree with the above note.  Patient is doing poorly, he is unresponsive this morning.  He is uremic.  He is DNR/DNI, moving towards comfort measures (palliative care to see this morning).  I do not think he has much time left at this point.  Family at bedside.  Will continue milrinone for now.   Larey Dresser 06/13/2013

## 2013-06-18 NOTE — Progress Notes (Signed)
Clinical Education officer, museum (CSW) received consult to assist in arranging for patient's son Emanuelle Bastos to travel to [facility] to see patient because he has a poor prognosis. Son is in the [employer] stationed in [place]. CSW contacted the [employer] to assist in getting son to [facility]. CSW spoke with Linus Sal[employer] employee who reported that they would reach out to son's command and then command will get in contact with son directly and make determination about leave. Per Juanda Crumble at [employer] they will get in touch with the son's command today. CSW explained to Juanda Crumble that this is a time sensitive case. CSW met with patient's wife and family members in the waiting room and made them aware of above. CSW gave wife reference number to call if she has questions about the case. CSW will continue to follow and assist as needed.   Case Reference number is [id number]   [employer] phone # [phone]   Basheer Molchan Morgan, Latanya Presser Weekend CSW [phone]

## 2013-06-18 NOTE — Progress Notes (Signed)
Chaplain responded to death call.  Chaplain provided spiritual support for wife of pt and stayed with her providing pastoral support until other family arrived.  When family arrived chaplain shared in prayer with them and ended visit.

## 2013-06-18 DEATH — deceased

## 2013-06-20 ENCOUNTER — Telehealth: Payer: Self-pay | Admitting: Cardiology

## 2013-06-20 NOTE — Telephone Encounter (Signed)
Incoming Call rec yesterday from [place] [facility] asking If Dr.McLean Will Sign D/C I let Her Know He is not In office this week I called Alma Friendly Dr.McDowell nurse asking If he Will Sign D/C since he is Pt's Cardiologist she was going to Check and call me back she called back and said it should Not be a Problem for Him to sign d/c, I came in this am with a VM stating Dr.McDowell Will not sign d/c. I received a call this am from  [facility] asking if I could Help in any way to get this d/c signed I went and spoke with Webb Silversmith L she made a Phone call to [place] who stated he will be in the Office sometime Friday 6.5.15 and he will sign the d/c. I called Bethena Roys back with [facility] made Her aware that Dr.McLean will Sign Friday and she ok'd she said d/c will be Dropped Off Tomorrow (Thursday 6.4) once Completed call Remer Macho and make them aware  6.3.15/km

## 2013-06-22 ENCOUNTER — Telehealth: Payer: Self-pay | Admitting: Cardiology

## 2013-06-22 NOTE — Discharge Summary (Signed)
Death Summary   Patient ID: GURNEY BALTHAZOR MRN: [id number], DOB/AGE: 1927-09-15 80 y.o. Admit date: 06/10/2013 D/C date:     06/21/2013  Primary Care Provider: Sherrie Mustache, MD Primary Cardiologist: Domenic Polite  Primary Discharge Diagnoses:  1. Severe acute on chronic respiratory failure, multifactorial due to pulmonary HTN, CHF, and COPD 2. Cardiogenic shock 3. CAD with NSTEMI this admission 4. Acute on CKD stage III with oliguria 5. Paroxysmal atrial fibrillation on Xarelto  6. COPD with chronic respiratory failure on home oxygen but noncompliant 7. Ischemic cardiomyopathy 8. Small-moderate pericardial effusion 9. Hypertensive heart disease with LVH on echocardiogram 10. Severe pulmonary hypertension 11. Hepatic lesion, possibly malignancy 12. Hyponatremia 13. Acute encephalopathy 14. HTN 15. Mitral regurgitation 16. Diabetes mellitus 17. Leukocytosis  Secondary Discharge Diagnoses:  1. H/o Biventricular ICD with [phone] lead 2. HLD 3. GERD 4. Sleep apnea 5. Anemia 6. Dementia 7. Thrombocytosis  Hospital Course: Mr. Mcgillis was an 80 y/o M with CAD, thrombocytosis, PAF on amiodarone/Xarelto, and a biventricular ICD with 6949-lead (VT/VF therapies turned off) who presented to [facility] on transfer from [facility] on 06/01/2013 with increasing SOB. He had acute respiratory failure requiring bipap. His troponin was >30 and he had evidence of renal insufficiency. He reportedly received IV fluids greater than 1 L while he was in the emergency room. He was also given Lasix 20 mg IV x1. Due to NSTEMI, he was transferred to [facility] for further management. He denied any chest pain but stated he just felt "bad" and "very weak" since the previous night. EKG showed atrial fibrillation with predominant BiV pacing. His Xarelto was held and he was started on heparin drip per pharmacy. His atrial fibrillation was felt to possibly be contributing to a CHF exacerbation. It  was felt that he would require DCCV in the future if he did not convert on his own. He was started on IV Lasix 80mg  BID and ARB was held due to CKD and need for diuresis/cath. 2D Echo 06/16/2013 showed moderate LVH, EF 30-35%, akinesis of the basal-mid inferior myocardium, mild MR, PA pressure 65mmHg, small-moderate pericardial effusion. He underwent cardiac cath on 05/18/2013 which showed diffuse small to moderate branch OM and diagonal disease with known occlusion of the RCA with left to right collaterals and moderate pulmonary venous hypertension. His cardiomyopathy was felt out of proportion with the extent of CAD as noted above. Medical management was recommended with medication optimization with diuresis/titration of CHF medications. Dr. Ellyn Hack did not feel that PCI on any of the potential lesions and would benefit his overall cardiac function or prognosis.   Despite diuresis the patient's respiratory status remained very tenuous. Pulmonology evaluated the patient and had suspicion for amiodarone toxicity thus stopped this medicine. They temporarily held diuretics and started the patient on empiric steroids and Duonebs - sed rate returned normal so pneumonitis was felt unlikely. CT of the chest without contrast suggested congestive heart failure with pulmonary interstitial edema and bilateral pleural effusions (versus pneumonitis), severe cardiomegaly with small pericardial effusion), and ill-defined lucency in the right hepatic lobe question malignancy. LE duplex was negative for DVT thus PE was felt unlikely especially in setting of chronic anticoagulation.  The original plan was to assess liver lesion once acute CP issues were improved. The patient developed acute on chronic renal failure with marginal UOP. IV milrinone was started to see if increased cardiac output would help. Dr. Haroldine Laws saw the patient who felt he was critically ill with multi-system organ  failure with poor prognosis. He spoke with the  patient and his wife regarding code status. The patient rescinded his DNR and requested aggressive measures despite our indicating that his prognosis is quite poor. The patient underwent RHC 05/28/2013 showing severe pulmonary HTN due to both left and right sided components and moderately elevated left-sided pressures with normal cardiac output on milrinone. Ultimately his respiratory failure was felt multifactorial due to CHF and severe underlying lung disease. Prognosis was again felt very grim. Attempts were made to diurese the patient but BUN/Cr continued to rise with little UOP. He also developed hyperkalemia. The patient became confused likely indicating acute encephalopathy. Further discussions were had with the patient's family about goals of care. Palliative care meeting was called and the patient was made DNR. The patient was kept on current treatment for his CHF but family was open to PRN benzodiazepines and pain control for comfort. The evening of 07-07-2013, the patient became bradycardic and his O2 sats and respirations decreased. RN came to bedside and upon assessment pt was agonally breathing, pale, and pulses were not palpable. Asystole was noted at 04/78/32. Two RN's listened for heart tones for one full minute with none assessed. Time of death declared at 2032-78-21.   Discharge Vitals: N/A Labs: Lab Results  Component Value Date   WBC 17.6* 07-07-2013   HGB 16.8 07-07-13   HCT 48.8 07-07-13   MCV 91.2 2013-07-07   PLT 214 07/07/2013    Recent Labs Lab Jul 07, 2013 0500 07-07-2013 0823  NA 127*  --   K 5.6*  --   CL 87*  --   CO2 20  --   BUN 148*  --   CREATININE 3.01*  --   CALCIUM 9.2  --   GLUCOSE 383* 443*    Lab Results  Component Value Date   CHOL 68 06/04/2013   HDL 14* 05/22/2013   LDLCALC 36 06/04/2013   TRIG 88 06/16/2013    Diagnostic Studies/Procedures   Ct Chest Wo Contrast  06/14/2013   CLINICAL DATA:  Respiratory failure.  EXAM: CT CHEST WITHOUT CONTRAST  TECHNIQUE:  Multidetector CT imaging of the chest was performed following the standard protocol without IV contrast.  COMPARISON:  Chest x-ray 06/16/2013.  FINDINGS: Thoracic aortic atherosclerotic vascular disease. No aneurysm. Cardiomegaly. Coronary artery disease. Cardiomegaly is severe. Small pericardial effusion is present. Cardiac pacer noted with lead tips present in the right atrium, right ventricle, and coronary sinus.  Shotty mediastinal lymph nodes are noted. Thoracic esophagus is unremarkable.  Large airways are patent. Diffuse severe bullous COPD. Diffuse pulmonary interstitial prominence with bilateral pleural effusions. These findings are consistent with congestive heart failure. Pneumonitis cannot be excluded. Atelectasis in the lung bases.  Adrenals are normal. Ill-defined lucency in the right hepatic lobe. A primary or metastatic lesion could present in this fashion. Contrast-enhanced CT or MRI of the liver may prove useful for further evaluation.  Thyroid unremarkable. No significant axillary adenopathy. Chest wall is intact. Degenerative changes thoracic spine.  IMPRESSION: 1. Findings suggesting congestive heart failure with pulmonary interstitial edema and bilateral pleural effusions. Pneumonitis cannot be excluded. 2. Coronary artery disease. Severe cardiomegaly. Small pericardial effusion. Cardiac pacer. 3. Ill-defined lucency in the right hepatic lobe. Primary or metastatic malignancy could present in this fashion. Contrast-enhanced CT or MRI of the liver may prove useful.   Electronically Signed   By: [place]   On: 06/14/2013 20:49   Dg Chest Port 1 View  05/26/2013   CLINICAL DATA:  Pulmonary infiltrates.  EXAM: PORTABLE CHEST - 1 VIEW  COMPARISON:  CT 06/14/2013.  Chest x-ray 05/27/2013 and 09/25/2011.  FINDINGS: Cardiomegaly. Cardiac pacer with lead tips in right atrium and right ventricle. Diffuse bilateral from interstitial prominence noted. This is chronic and consistent chronic  interstitial lung disease. A mild active interstitial process cannot be excluded. Mild interstitial edema cannot be excluded. No pleural effusion or pneumothorax.  IMPRESSION: 1. Chronic interstitial lung disease. Superimposed active interstitial lung disease cannot be excluded. 2. Severe cardiomegaly. This is stable. Cardiac pacer present. A mild component of pulmonary interstitial edema cannot be excluded .   Electronically Signed   By: [place]   On: 05/30/2013 07:32   Dg Chest Port 1 View  06/04/2013   CLINICAL DATA:  Shortness of breath.  EXAM: PORTABLE CHEST - 1 VIEW  COMPARISON:  Chest radiograph performed earlier today at 10:22 a.m.  FINDINGS: The lungs are well-aerated. Chronically increased interstitial markings are again seen. This is somewhat less prominent than on the prior study, though worsened from 2013. As before, this may reflect early infectious or inflammatory airspace disease or progressive chronic lung disease. No pleural effusion or pneumothorax is seen.  The cardiomediastinal silhouette is mildly enlarged. A pacemaker/AICD is noted overlying the left chest wall, with leads ending overlying the right atrium, right ventricle and coronary sinus. No acute osseous abnormalities are seen.  IMPRESSION: Chronically increased interstitial markings again seen, somewhat less prominent than on the prior study, though worsened from 2013. As before, this may reflect early infectious or inflammatory airspace disease, superimposed on the patient's chronic findings, or progressive chronic lung disease.   Electronically Signed   By: Garald Balding M.D.   On: 06/15/2013 01:31    Discharge Medications  N/A  Disposition   The patient expired at [facility].       Duration of Encounter: Greater than 30 minutes including physician and PA time.  Signed, Charlie Pitter PA-C 06/20/2013, 2:34 PM

## 2013-06-22 NOTE — Telephone Encounter (Signed)
Original D/C received From [place] [facility] gave to [place] who is Covering Dr.McLean today For Signature 6.5.15/km

## 2013-06-26 ENCOUNTER — Telehealth: Payer: Self-pay | Admitting: Cardiology

## 2013-06-26 NOTE — Telephone Encounter (Signed)
D/c picked up

## 2013-06-26 NOTE — Telephone Encounter (Signed)
D/C Signed by Dr.McLean [name] W/ [facility] home aware ready For Pick up 6.9.15/km

## 2013-07-18 DEATH — deceased

## 2013-12-27 ENCOUNTER — Encounter (HOSPITAL_COMMUNITY): Payer: Self-pay | Admitting: Cardiology

## 2015-09-18 IMAGING — CT CT CHEST W/O CM
2 of 3 series · 15 of 36 positions shown, 18 images · non-contrast
Comparison: Chest x-ray 06/11/2013.

CLINICAL DATA: Respiratory failure.

EXAM:
CT CHEST WITHOUT CONTRAST
TECHNIQUE: Multidetector CT imaging of the chest was performed following the
standard protocol without IV contrast..

[Series 2: thorax 5.0 i31f 1 · axial · 0.80mm/px · z∈[+1041,+1326]mm · 12 of 67 slices shown, 15 images]
[im 5/67  mediastinal]
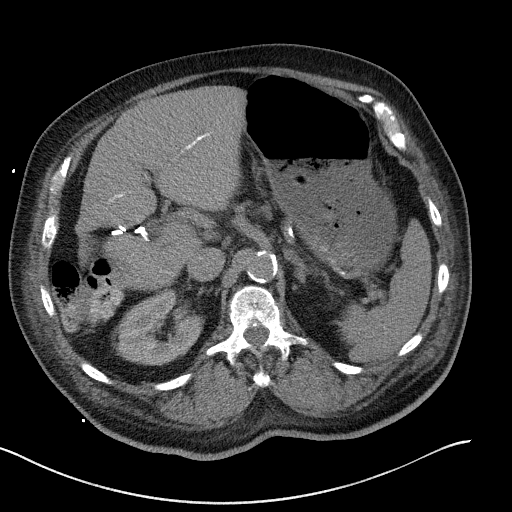
[im 5/67  lung]
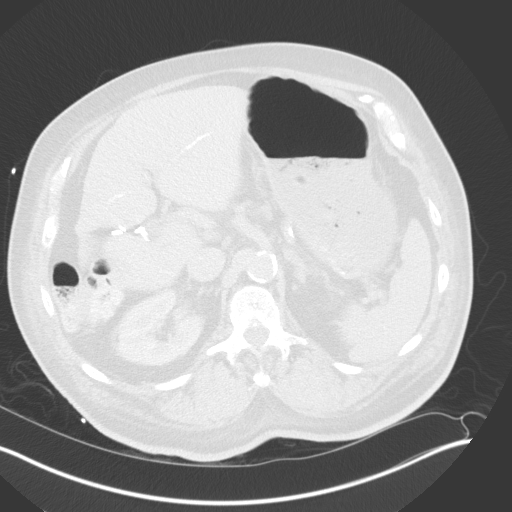
[im 10/67  lung]
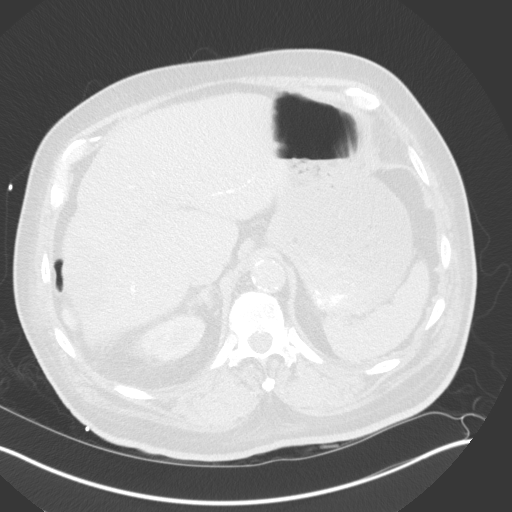
[im 15/67  lung]
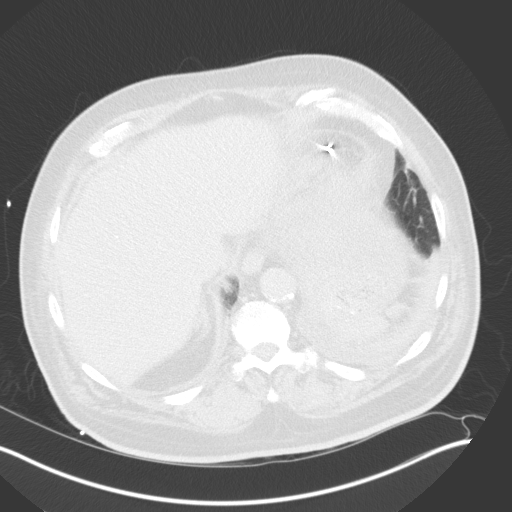
[im 20/67  lung]
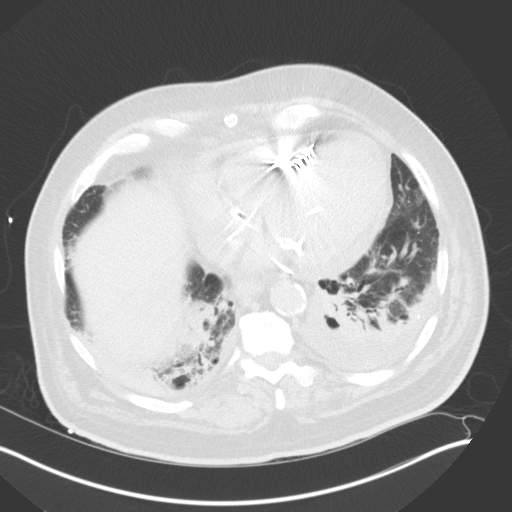
[im 25/67  mediastinal]
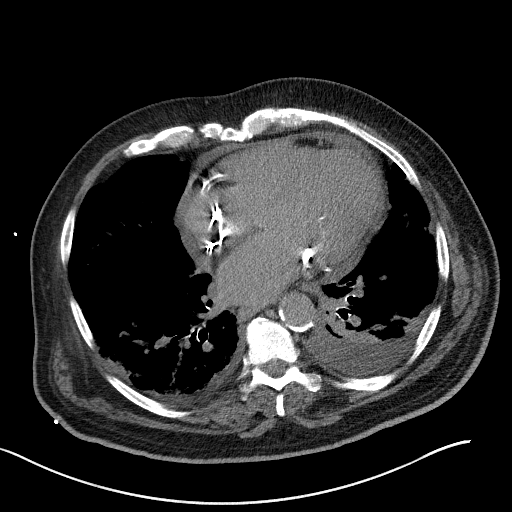
[im 25/67  lung]
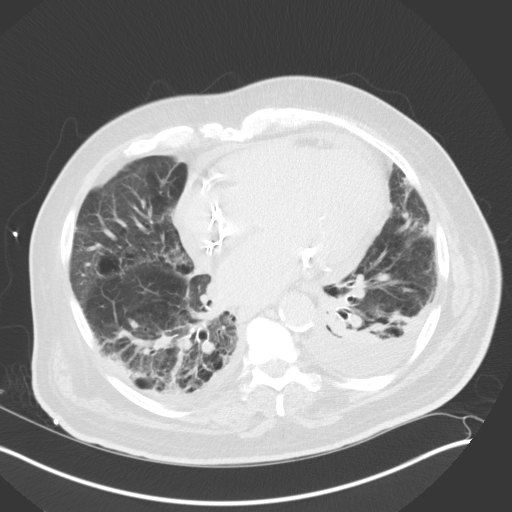
[im 30/67  lung]
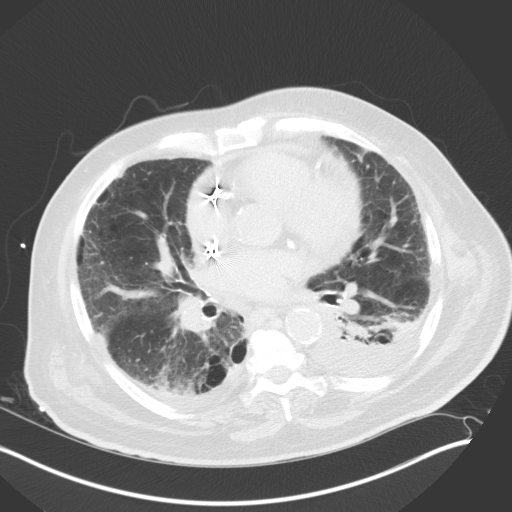
[im 37/67  lung]
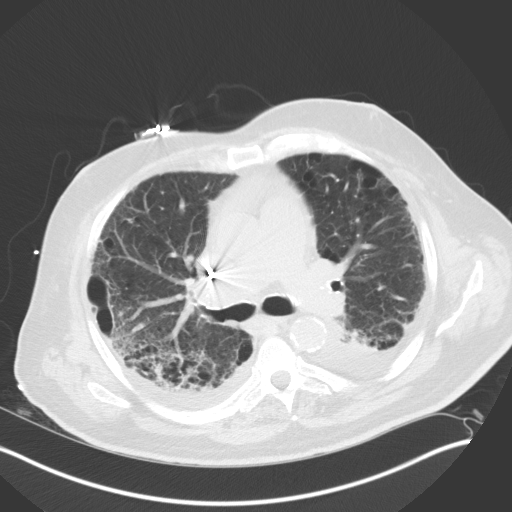
[im 42/67  lung]
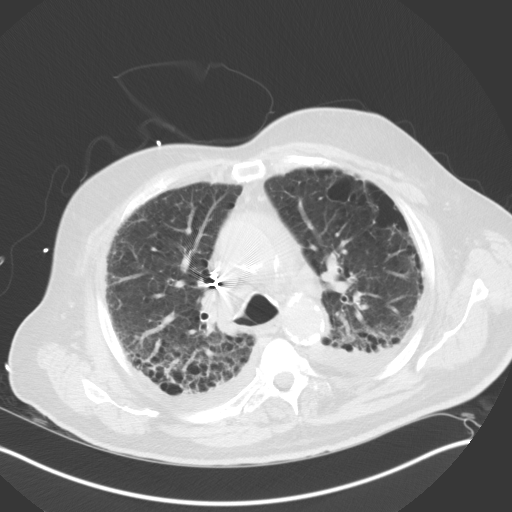
[im 47/67  mediastinal]
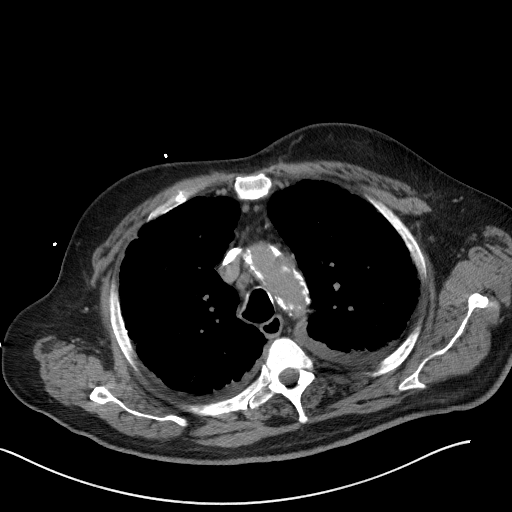
[im 47/67  lung]
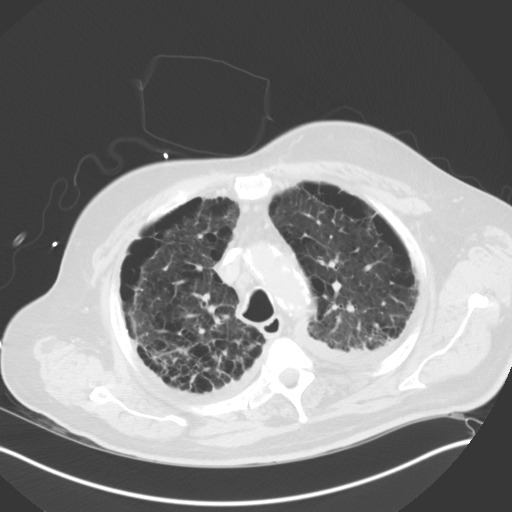
[im 52/67  lung]
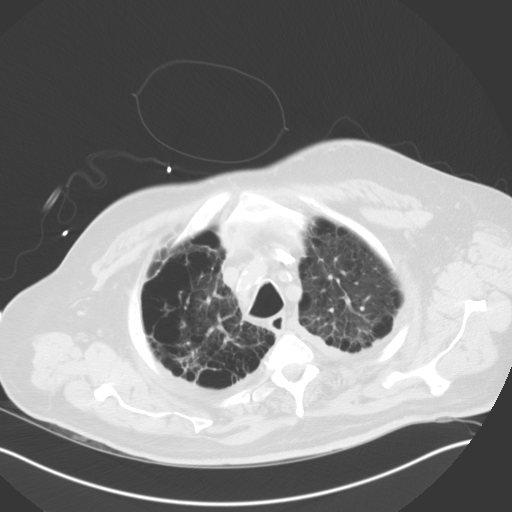
[im 57/67  lung]
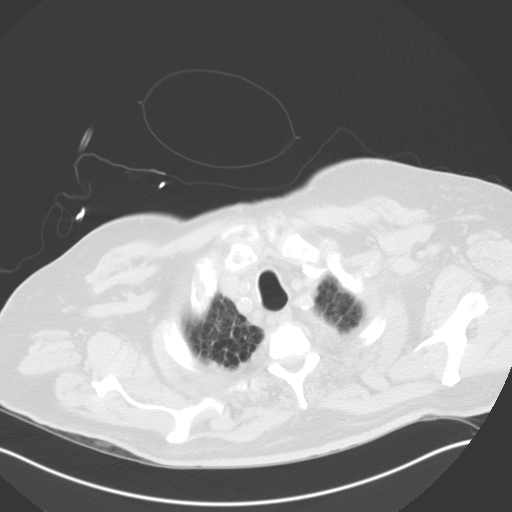
[im 62/67  lung]
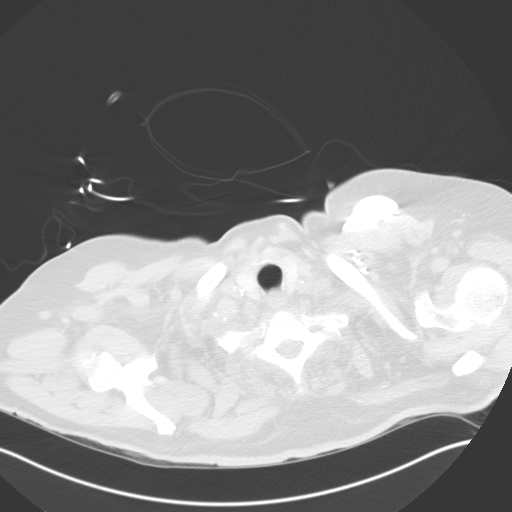

[Series 602: cor · coronal · 0.80mm/px · 3 of 133 slices shown]
[im 27/133  lung]
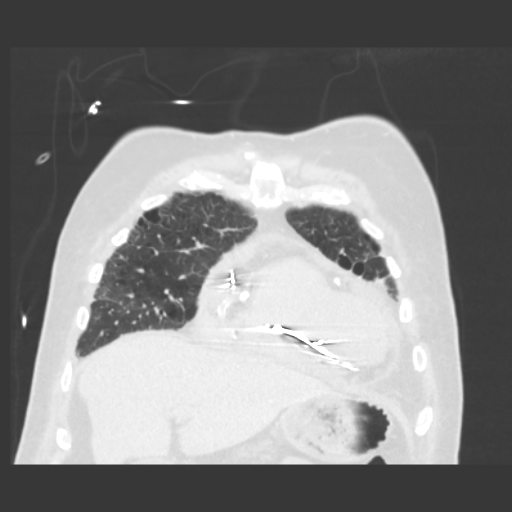
[im 53/133  lung]
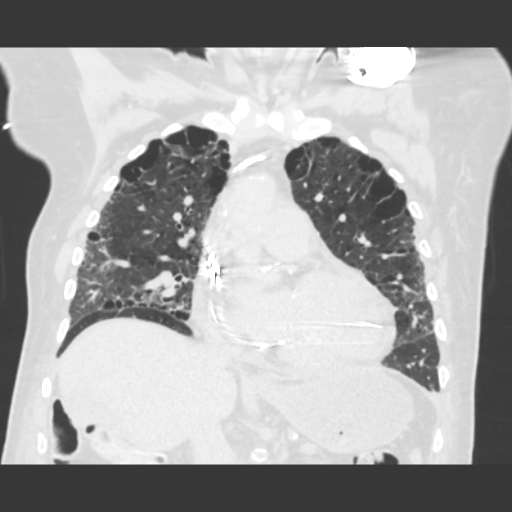
[im 80/133  lung]
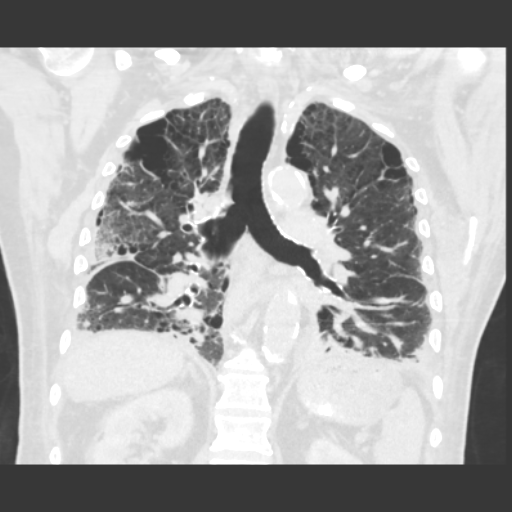

[15 of 36 positions shown; findings below may reference images not displayed]

FINDINGS: Thoracic aortic atherosclerotic vascular disease. No aneurysm.
Cardiomegaly. Coronary artery disease. Cardiomegaly is severe. Small
pericardial effusion is present. Cardiac pacer noted with lead tips
present in the right atrium, right ventricle, and coronary sinus.

Shotty mediastinal lymph nodes are noted. Thoracic esophagus is
unremarkable.

Large airways are patent. Diffuse severe bullous COPD. Diffuse
pulmonary interstitial prominence with bilateral pleural effusions.
These findings are consistent with congestive heart failure.
Pneumonitis cannot be excluded. Atelectasis in the lung bases.

Adrenals are normal. Ill-defined lucency in the right hepatic lobe.
A primary or metastatic lesion could present in this fashion.
Contrast-enhanced CT or MRI of the liver may prove useful for
further evaluation.

Thyroid unremarkable. No significant axillary adenopathy. Chest wall
is intact. Degenerative changes thoracic spine.
IMPRESSION: 1. Findings suggesting congestive heart failure with pulmonary
interstitial edema and bilateral pleural effusions. Pneumonitis
cannot be excluded.
2. Coronary artery disease. Severe cardiomegaly. Small pericardial
effusion. Cardiac pacer.
3. Ill-defined lucency in the right hepatic lobe. Primary or
metastatic malignancy could present in this fashion.
Contrast-enhanced CT or MRI of the liver may prove useful.

## 2015-09-19 IMAGING — CR DG CHEST 1V PORT
1 series · 1 of 1 positions shown · non-contrast
Comparison: CT 06/14/2013.  Chest x-ray 06/11/2013 and 09/25/2011.

CLINICAL DATA: Pulmonary infiltrates.

EXAM:
PORTABLE CHEST - 1 VIEW

[AP]
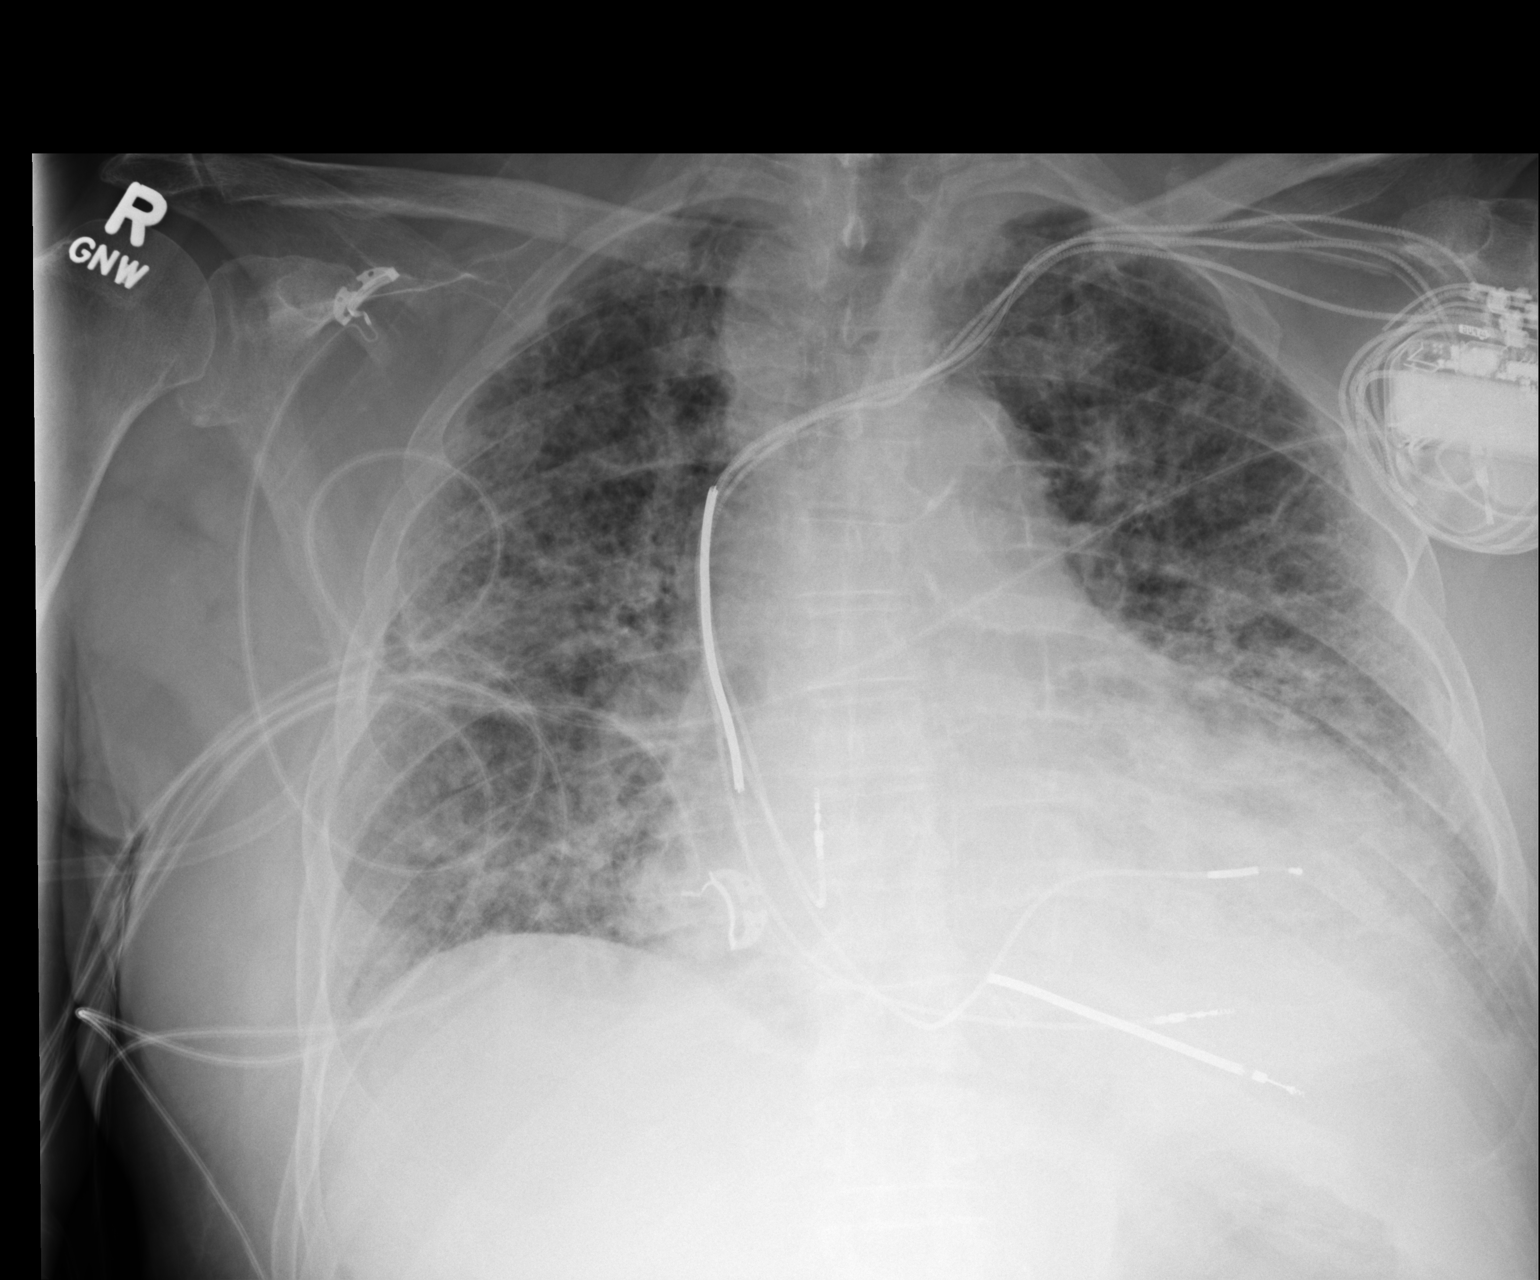

[1 of 1 positions shown; findings below may reference images not displayed]

FINDINGS: Cardiomegaly. Cardiac pacer with lead tips in right atrium and right
ventricle. Diffuse bilateral from interstitial prominence noted.
This is chronic and consistent chronic interstitial lung disease. A
mild active interstitial process cannot be excluded. Mild
interstitial edema cannot be excluded. No pleural effusion or
pneumothorax.
IMPRESSION: 1. Chronic interstitial lung disease. Superimposed active
interstitial lung disease cannot be excluded.
2. Severe cardiomegaly. This is stable. Cardiac pacer present. A
mild component of pulmonary interstitial edema cannot be excluded .
